# Patient Record
Sex: Female | Born: 1995 | Race: Black or African American | Hispanic: No | Marital: Single | Smoking: Never smoker
Health system: Southern US, Community
[De-identification: ages and names within clinical notes are randomized; demographics above are authoritative.]

## PROBLEM LIST (undated history)

## (undated) DIAGNOSIS — N83209 Unspecified ovarian cyst, unspecified side: Secondary | ICD-10-CM

## (undated) DIAGNOSIS — Z789 Other specified health status: Secondary | ICD-10-CM

## (undated) HISTORY — PX: CYST EXCISION: SHX5701

---
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
## 2014-06-04 ENCOUNTER — Inpatient Hospital Stay (HOSPITAL_COMMUNITY)
Admission: AD | Admit: 2014-06-04 | Discharge: 2014-06-04 | Disposition: A | Discharge status: Home / Self Care | Payer: PRIVATE HEALTH INSURANCE | Source: Ambulatory Visit | Attending: Obstetrics and Gynecology | Admitting: Obstetrics and Gynecology

## 2014-06-04 DIAGNOSIS — T8389XA Other specified complication of genitourinary prosthetic devices, implants and grafts, initial encounter: Secondary | ICD-10-CM

## 2014-06-04 DIAGNOSIS — T8339XA Other mechanical complication of intrauterine contraceptive device, initial encounter: Secondary | ICD-10-CM | POA: Diagnosis present

## 2014-06-04 DIAGNOSIS — N390 Urinary tract infection, site not specified: Secondary | ICD-10-CM | POA: Diagnosis not present

## 2014-06-04 LAB — URINE MICROSCOPIC-ADD ON

## 2014-06-04 LAB — URINALYSIS, ROUTINE W REFLEX MICROSCOPIC
Bilirubin Urine: NEGATIVE
Glucose, UA: NEGATIVE mg/dL
KETONES UR: NEGATIVE mg/dL
NITRITE: POSITIVE — AB
PH: 6 (ref 5.0–8.0)
Protein, ur: NEGATIVE mg/dL
Specific Gravity, Urine: 1.025 (ref 1.005–1.030)
Urobilinogen, UA: 0.2 mg/dL (ref 0.0–1.0)

## 2014-06-04 LAB — POCT PREGNANCY, URINE: Preg Test, Ur: NEGATIVE

## 2014-06-04 MED ORDER — SULFAMETHOXAZOLE-TRIMETHOPRIM 800-160 MG PO TABS: 1.0000 | ORAL_TABLET | Freq: Two times a day (BID) | ORAL | Status: AC

## 2014-06-04 MED ORDER — NORGESTIMATE-ETH ESTRADIOL 0.25-35 MG-MCG PO TABS: 1.0000 | ORAL_TABLET | Freq: Every day | ORAL | Status: DC

## 2014-06-04 MED ORDER — PHENAZOPYRIDINE HCL 200 MG PO TABS
200.0000 mg | ORAL_TABLET | Freq: Three times a day (TID) | ORAL | Status: DC
Start: 2014-06-04 — End: 2014-11-01

## 2014-06-04 NOTE — MAU Provider Note (Signed)
Subjective:  Alexandra Quinn is a 19 y.o. female No obstetric history on file who presents with concerns about her IUD coming out. She has had her IUD since last July and last night during intercourse the IUD came out. The patient brought the IUD into MAU; intact. She would like another form of birth control until she can get into a practice to get another IUD place.  She is also concerned because her urine has an unusual odor.   Denies fever. Currently has mild, lower abdominal cramping that comes and goes.    Objective:  GENERAL: Well-developed, well-nourished female in no acute distress.  LUNGS: Effort normal SKIN: Warm, dry and without erythema PSYCH: Normal mood and affect  Filed Vitals:   06/04/14 1409  BP: 115/69  Pulse: 69  Temp: 97.8 F (36.6 C)  Resp: 18   Results for orders placed or performed during the hospital encounter of 06/04/14 (from the past 72 hour(s))  Urinalysis, Routine w reflex microscopic     Status: Abnormal   Collection Time: 06/04/14  2:20 PM  Result Value Ref Range   Color, Urine YELLOW YELLOW   APPearance CLEAR CLEAR   Specific Gravity, Urine 1.025 1.005 - 1.030   pH 6.0 5.0 - 8.0   Glucose, UA NEGATIVE NEGATIVE mg/dL   Hgb urine dipstick MODERATE (A) NEGATIVE   Bilirubin Urine NEGATIVE NEGATIVE   Ketones, ur NEGATIVE NEGATIVE mg/dL   Protein, ur NEGATIVE NEGATIVE mg/dL   Urobilinogen, UA 0.2 0.0 - 1.0 mg/dL   Nitrite POSITIVE (A) NEGATIVE   Leukocytes, UA SMALL (A) NEGATIVE  Urine microscopic-add on     Status: Abnormal   Collection Time: 06/04/14  2:20 PM  Result Value Ref Range   Squamous Epithelial / LPF FEW (A) RARE   WBC, UA 7-10 <3 WBC/hpf   RBC / HPF 3-6 <3 RBC/hpf   Bacteria, UA MANY (A) RARE   Urine-Other MUCOUS PRESENT   Pregnancy, urine POC     Status: None   Collection Time: 06/04/14  2:45 PM  Result Value Ref Range   Preg Test, Ur NEGATIVE NEGATIVE    Comment:        THE SENSITIVITY OF THIS METHODOLOGY IS >24 mIU/mL     MDM  Urine culture.    Assessment:  1. UTI (lower urinary tract infection)   2. IUD complication, initial encounter     Plan:  Discharge home in stable condition RX: bactrim, pyridium, sprintec  Follow up with health dep. Or WOC or IUD placement.  Condoms always    Duane LopeJennifer I Larra Crunkleton, NP 06/04/2014 3:41 PM

## 2014-06-04 NOTE — MAU Note (Signed)
Pt had sex last night, condom was stuck, pt removed condom & IUD came out also.  Lower abd cramping, is bleeding, was bleeding before IUD came out.  Urine has unusual odor.

## 2014-06-06 LAB — URINE CULTURE: Colony Count: >=100000

## 2014-11-01 ENCOUNTER — Inpatient Hospital Stay (HOSPITAL_COMMUNITY)
Admission: AD | Admit: 2014-11-01 | Discharge: 2014-11-01 | Disposition: A | Discharge status: Home / Self Care | Payer: Medicaid - Out of State | Source: Ambulatory Visit | Attending: Family Medicine | Admitting: Family Medicine

## 2014-11-01 ENCOUNTER — Encounter (HOSPITAL_COMMUNITY): Payer: Self-pay | Admitting: *Deleted

## 2014-11-01 DIAGNOSIS — B9689 Other specified bacterial agents as the cause of diseases classified elsewhere: Secondary | ICD-10-CM | POA: Insufficient documentation

## 2014-11-01 DIAGNOSIS — N898 Other specified noninflammatory disorders of vagina: Secondary | ICD-10-CM | POA: Diagnosis present

## 2014-11-01 DIAGNOSIS — A499 Bacterial infection, unspecified: Secondary | ICD-10-CM

## 2014-11-01 DIAGNOSIS — N760 Acute vaginitis: Secondary | ICD-10-CM | POA: Insufficient documentation

## 2014-11-01 LAB — URINALYSIS, ROUTINE W REFLEX MICROSCOPIC
Bilirubin Urine: NEGATIVE
Glucose, UA: NEGATIVE mg/dL
Hgb urine dipstick: NEGATIVE
Ketones, ur: NEGATIVE mg/dL
LEUKOCYTES UA: NEGATIVE
Nitrite: NEGATIVE
PH: 7 (ref 5.0–8.0)
Protein, ur: NEGATIVE mg/dL
SPECIFIC GRAVITY, URINE: 1.01 (ref 1.005–1.030)
UROBILINOGEN UA: 1 mg/dL (ref 0.0–1.0)

## 2014-11-01 LAB — POCT PREGNANCY, URINE: PREG TEST UR: NEGATIVE

## 2014-11-01 LAB — WET PREP, GENITAL
TRICH WET PREP: NONE SEEN
YEAST WET PREP: NONE SEEN

## 2014-11-01 MED ORDER — METRONIDAZOLE 500 MG PO TABS: 500.0000 mg | ORAL_TABLET | Freq: Two times a day (BID) | ORAL | Status: DC

## 2014-11-01 NOTE — Discharge Instructions (Signed)
Bacterial Vaginosis Bacterial vaginosis is a vaginal infection that occurs when the normal balance of bacteria in the vagina is disrupted. It results from an overgrowth of certain bacteria. This is the most common vaginal infection in women of childbearing age. Treatment is important to prevent complications, especially in pregnant women, as it can cause a premature delivery. CAUSES  Bacterial vaginosis is caused by an increase in harmful bacteria that are normally present in smaller amounts in the vagina. Several different kinds of bacteria can cause bacterial vaginosis. However, the reason that the condition develops is not fully understood. RISK FACTORS Certain activities or behaviors can put you at an increased risk of developing bacterial vaginosis, including:  Having a new sex partner or multiple sex partners.  Douching.  Using an intrauterine device (IUD) for contraception. Women do not get bacterial vaginosis from toilet seats, bedding, swimming pools, or contact with objects around them. SIGNS AND SYMPTOMS  Some women with bacterial vaginosis have no signs or symptoms. Common symptoms include:  Grey vaginal discharge.  A fishlike odor with discharge, especially after sexual intercourse.  Itching or burning of the vagina and vulva.  Burning or pain with urination. DIAGNOSIS  Your health care provider will take a medical history and examine the vagina for signs of bacterial vaginosis. A sample of vaginal fluid may be taken. Your health care provider will look at this sample under a microscope to check for bacteria and abnormal cells. A vaginal pH test may also be done.  TREATMENT  Bacterial vaginosis may be treated with antibiotic medicines. These may be given in the form of a pill or a vaginal cream. A second round of antibiotics may be prescribed if the condition comes back after treatment.  HOME CARE INSTRUCTIONS   Only take over-the-counter or prescription medicines as  directed by your health care provider.  If antibiotic medicine was prescribed, take it as directed. Make sure you finish it even if you start to feel better.  Do not have sex until treatment is completed.  Tell all sexual partners that you have a vaginal infection. They should see their health care provider and be treated if they have problems, such as a mild rash or itching.  Practice safe sex by using condoms and only having one sex partner. SEEK MEDICAL CARE IF:   Your symptoms are not improving after 3 days of treatment.  You have increased discharge or pain.  You have a fever. MAKE SURE YOU:   Understand these instructions.  Will watch your condition.  Will get help right away if you are not doing well or get worse. FOR MORE INFORMATION  Centers for Disease Control and Prevention, Division of STD Prevention: www.cdc.gov/std American Sexual Health Association (ASHA): www.ashastd.org  Document Released: 02/02/2005 Document Revised: 11/23/2012 Document Reviewed: 09/14/2012 ExitCare Patient Information 2015 [employer]. This information is not intended to replace advice given to you by your health care provider. Make sure you discuss any questions you have with your health care provider.  

## 2014-11-01 NOTE — MAU Provider Note (Signed)
History   [id number]   Chief Complaint  Patient presents with  . Vaginal Discharge    HPI Alexandra Quinn is a 19 y.o. female  G1P0000 here with report of white vaginal discharge that started one week ago.  +"fishy odor".    Patient's last menstrual period was 10/06/2014.  OB History  Gravida Para Term Preterm AB SAB TAB Ectopic Multiple Living  1 0 0 0 0     0    # Outcome Date GA Lbr Len/2nd Weight Sex Delivery Anes PTL Lv  1 Gravida               History reviewed. No pertinent past medical history.  History reviewed. No pertinent family history.  Social History   Social History  . Marital Status: Single    Spouse Name: N/A  . Number of Children: N/A  . Years of Education: N/A   Social History Main Topics  . Smoking status: Never Smoker   . Smokeless tobacco: Never Used  . Alcohol Use: No  . Drug Use: No  . Sexual Activity: Yes    Birth Control/ Protection: IUD   Other Topics Concern  . None   Social History Narrative  . None    Allergies not on file  No current facility-administered medications on file prior to encounter.   Current Outpatient Prescriptions on File Prior to Encounter  Medication Sig Dispense Refill  . norgestimate-ethinyl estradiol (ORTHO-CYCLEN,SPRINTEC,PREVIFEM) 0.25-35 MG-MCG tablet Take 1 tablet by mouth daily. 1 Package 11  . phenazopyridine (PYRIDIUM) 200 MG tablet Take 1 tablet (200 mg total) by mouth 3 (three) times daily. 6 tablet 0     Review of Systems  Genitourinary: Positive for vaginal discharge (white; "fishy odor"). Negative for vaginal bleeding.  All other systems reviewed and are negative.    Physical Exam   Filed Vitals:   11/01/14 1922  BP: 117/69  Pulse: 53  Temp: 98.4 F (36.9 C)  TempSrc: Oral  Resp: 16  Height: 5\' 3"  (1.6 m)  Weight: 63.957 kg (141 lb)  SpO2: 100%    Physical Exam  Constitutional: She is oriented to person, place, and time. She appears well-developed and well-nourished. No  distress.  HENT:  Head: Normocephalic.  Neck: Normal range of motion. Neck supple.  Cardiovascular: Normal rate, regular rhythm and normal heart sounds.   Respiratory: Effort normal and breath sounds normal. No respiratory distress.  GI: Soft. She exhibits no mass. There is no tenderness. There is no rebound, no guarding and no CVA tenderness.  Genitourinary: Cervix exhibits no motion tenderness and no discharge. Vaginal discharge (white, creamy) found.  Musculoskeletal: Normal range of motion. She exhibits no edema.  Neurological: She is alert and oriented to person, place, and time.  Skin: Skin is warm and dry.  Psychiatric: She has a normal mood and affect.    MAU Course  Procedures  MDM Results for orders placed or performed during the hospital encounter of 11/01/14 (from the past 24 hour(s))  Urinalysis, Routine w reflex microscopic (not at [facility])     Status: None   Collection Time: 11/01/14  7:24 PM  Result Value Ref Range   Color, Urine YELLOW YELLOW   APPearance CLEAR CLEAR   Specific Gravity, Urine 1.010 1.005 - 1.030   pH 7.0 5.0 - 8.0   Glucose, UA NEGATIVE NEGATIVE mg/dL   Hgb urine dipstick NEGATIVE NEGATIVE   Bilirubin Urine NEGATIVE NEGATIVE   Ketones, ur NEGATIVE NEGATIVE mg/dL   Protein,  ur NEGATIVE NEGATIVE mg/dL   Urobilinogen, UA 1.0 0.0 - 1.0 mg/dL   Nitrite NEGATIVE NEGATIVE   Leukocytes, UA NEGATIVE NEGATIVE  Pregnancy, urine POC     Status: None   Collection Time: 11/01/14  7:31 PM  Result Value Ref Range   Preg Test, Ur NEGATIVE NEGATIVE  Wet prep, genital     Status: Abnormal   Collection Time: 11/01/14  8:15 PM  Result Value Ref Range   Yeast Wet Prep HPF POC NONE SEEN NONE SEEN   Trich, Wet Prep NONE SEEN NONE SEEN   Clue Cells Wet Prep HPF POC FEW (A) NONE SEEN   WBC, Wet Prep HPF POC FEW (A) NONE SEEN     Assessment and Plan   Bacterial Vaginosis  Plan: Discharge to home RX Flagyl 500 mg BID x 7 days Follow-up for worsening or no  improvement in symptoms  Marlis Edelson, CNM

## 2014-11-01 NOTE — MAU Note (Signed)
Pt reports she has bacterial vaginosis. States she gets this a lot and she knows that is what it is due to the vaginal odor.

## 2014-12-23 ENCOUNTER — Inpatient Hospital Stay (HOSPITAL_COMMUNITY): Payer: Medicaid - Out of State

## 2014-12-23 ENCOUNTER — Encounter (HOSPITAL_COMMUNITY): Payer: Self-pay | Admitting: *Deleted

## 2014-12-23 ENCOUNTER — Inpatient Hospital Stay (HOSPITAL_COMMUNITY)
Admission: AD | Admit: 2014-12-23 | Discharge: 2014-12-23 | Disposition: A | Discharge status: Home / Self Care | Payer: Medicaid - Out of State | Source: Ambulatory Visit | Attending: Obstetrics & Gynecology | Admitting: Obstetrics & Gynecology

## 2014-12-23 DIAGNOSIS — R300 Dysuria: Secondary | ICD-10-CM | POA: Diagnosis not present

## 2014-12-23 DIAGNOSIS — N920 Excessive and frequent menstruation with regular cycle: Secondary | ICD-10-CM | POA: Insufficient documentation

## 2014-12-23 DIAGNOSIS — R1032 Left lower quadrant pain: Secondary | ICD-10-CM | POA: Diagnosis present

## 2014-12-23 DIAGNOSIS — T8389XA Other specified complication of genitourinary prosthetic devices, implants and grafts, initial encounter: Secondary | ICD-10-CM

## 2014-12-23 DIAGNOSIS — Y838 Other surgical procedures as the cause of abnormal reaction of the patient, or of later complication, without mention of misadventure at the time of the procedure: Secondary | ICD-10-CM | POA: Insufficient documentation

## 2014-12-23 DIAGNOSIS — Z8742 Personal history of other diseases of the female genital tract: Secondary | ICD-10-CM

## 2014-12-23 DIAGNOSIS — T8332XA Displacement of intrauterine contraceptive device, initial encounter: Secondary | ICD-10-CM

## 2014-12-23 DIAGNOSIS — T8384XA Pain from genitourinary prosthetic devices, implants and grafts, initial encounter: Secondary | ICD-10-CM | POA: Diagnosis not present

## 2014-12-23 DIAGNOSIS — Z30432 Encounter for removal of intrauterine contraceptive device: Secondary | ICD-10-CM | POA: Diagnosis not present

## 2014-12-23 HISTORY — DX: Other specified health status: Z78.9

## 2014-12-23 LAB — WET PREP, GENITAL
CLUE CELLS WET PREP: NONE SEEN
TRICH WET PREP: NONE SEEN
Yeast Wet Prep HPF POC: NONE SEEN

## 2014-12-23 LAB — URINALYSIS, ROUTINE W REFLEX MICROSCOPIC
Bilirubin Urine: NEGATIVE
GLUCOSE, UA: NEGATIVE mg/dL
Hgb urine dipstick: NEGATIVE
Ketones, ur: NEGATIVE mg/dL
Leukocytes, UA: NEGATIVE
Nitrite: NEGATIVE
PH: 6 (ref 5.0–8.0)
Protein, ur: NEGATIVE mg/dL
SPECIFIC GRAVITY, URINE: 1.02 (ref 1.005–1.030)
Urobilinogen, UA: 0.2 mg/dL (ref 0.0–1.0)

## 2014-12-23 LAB — POCT PREGNANCY, URINE: Preg Test, Ur: NEGATIVE

## 2014-12-23 LAB — CBC
HCT: 36.6 % (ref 36.0–46.0)
Hemoglobin: 11.8 g/dL — ABNORMAL LOW (ref 12.0–15.0)
MCH: 29.4 pg (ref 26.0–34.0)
MCHC: 32.2 g/dL (ref 30.0–36.0)
MCV: 91.3 fL (ref 78.0–100.0)
PLATELETS: 200 10*3/uL (ref 150–400)
RBC: 4.01 MIL/uL (ref 3.87–5.11)
RDW: 15 % (ref 11.5–15.5)
WBC: 4.2 10*3/uL (ref 4.0–10.5)

## 2014-12-23 MED ORDER — IBUPROFEN 600 MG PO TABS
600.0000 mg | ORAL_TABLET | Freq: Once | ORAL | Status: AC
  Administered 2014-12-23: 600 mg via ORAL
  Filled 2014-12-23: qty 1

## 2014-12-23 MED ORDER — IBUPROFEN 600 MG PO TABS: 600.0000 mg | ORAL_TABLET | Freq: Four times a day (QID) | ORAL | Status: DC | PRN

## 2014-12-23 NOTE — MAU Note (Signed)
Patient presents stating that se has an IUD in place with c/o left sided flank pain X 2-3 months and painful urination X 1 month. States she had surgery to remove a left ovarian cyst in 2013. States she had bleeding last week and passed clots and also c/o discharge.

## 2014-12-23 NOTE — MAU Provider Note (Signed)
Chief Complaint: Flank Pain and Urinary Tract Infection   First Provider Initiated Contact with Patient 12/23/14 1147     SUBJECTIVE HPI: Alexandra Quinn is a 19 y.o. G1P0010 female w/ Liletta IUD in place who presents to Maternity Admissions reporting:  1. LLQ pain x 2-3 months 2. Heavy period w/ clots past few cycles. Had been light and short since IUD placed this summer. 3. Dysuria x 1 month. Feels burning only for several seconds or minutes after urination stops.  4. Increased vaginal discharge   PAIN Location: Left lower quadrant Quality: Stabbing Severity: 8/10 on pain scale Duration: 2-3 months Context: None Timing: Intermittent Modifying factors: Improves with warm compresses. Hasn't tried any medicines or other treatment for pain. Associated signs and symptoms: Positive for heavy periods with clots, vaginal discharge, nausea and vomiting x 1 and dysuria. Negative for fever, chills, passage of IUD, dyspareunia, vaginal odor or vaginal itching.  Had IUD before that was expelled in April 2016.  Past Medical History  Diagnosis Date  . Medical history non-contributory    OB History  Gravida Para Term Preterm AB SAB TAB Ectopic Multiple Living  1 0 0 0 1 1    0    # Outcome Date GA Lbr Len/2nd Weight Sex Delivery Anes PTL Lv  1 SAB              Past Surgical History  Procedure Laterality Date  . Cyst excision    . Ovarian cyst surgery  2013   Social History   Social History  . Marital Status: Single    Spouse Name: N/A  . Number of Children: N/A  . Years of Education: N/A   Occupational History  . Not on file.   Social History Main Topics  . Smoking status: Never Smoker   . Smokeless tobacco: Never Used  . Alcohol Use: No  . Drug Use: No  . Sexual Activity: Yes    Birth Control/ Protection: IUD   Other Topics Concern  . Not on file   Social History Narrative   No current facility-administered medications on file prior to encounter.   Current  Outpatient Prescriptions on File Prior to Encounter  Medication Sig Dispense Refill  . Levonorgestrel (SKYLA) 13.5 MG IUD 1 application by Intrauterine route daily. Replaced every 4 years, inserted 07/2014    . metroNIDAZOLE (FLAGYL) 500 MG tablet Take 1 tablet (500 mg total) by mouth 2 (two) times daily. 14 tablet 0   No Known Allergies  I have reviewed the past Medical Hx, Surgical Hx, Social Hx, Allergies and Medications.   Review of Systems  Constitutional: Negative for fever, chills and appetite change.  Gastrointestinal: Positive for nausea (x 2 days ), vomiting (x1) and abdominal pain. Negative for diarrhea, constipation, blood in stool and abdominal distention.    OBJECTIVE Patient Vitals for the past 24 hrs:  BP Temp Temp src Pulse Resp Height Weight  12/23/14 1313 127/58 mmHg - - (!) 59 16 - -  12/23/14 1041 121/77 mmHg 99.1 F (37.3 C) Oral (!) 57 16  (1.651 m) 64.524 kg (142 lb 4 oz)   Constitutional: Well-developed, well-nourished female in no acute distress.  Cardiovascular: Mild, asymptomatic bradycardia Respiratory: normal rate and effort.  GI: Abd soft, mild left lower quadrant and mid left abdominal tenderness to palpation. No guarding, rebound tenderness or masses. Pos BS x 4 MS: Extremities nontender, no edema. Neurologic: Alert and oriented x 4.  GU: Neg CVAT.  SPECULUM EXAM:  NEFG, physiologic discharge, no blood noted, cervix clean. IUD strings visualized. Device not protruding through os  BIMANUAL: cervix closed; uterus normal size, no adnexal tenderness or masses. No CMT.  LAB RESULTS Results for orders placed or performed during the hospital encounter of 12/23/14 (from the past 24 hour(s))  Urinalysis, Routine w reflex microscopic (not at [facility])     Status: None   Collection Time: 12/23/14 10:51 AM  Result Value Ref Range   Color, Urine YELLOW YELLOW   APPearance CLEAR CLEAR   Specific Gravity, Urine 1.020 1.005 - 1.030   pH 6.0 5.0 - 8.0   Glucose,  UA NEGATIVE NEGATIVE mg/dL   Hgb urine dipstick NEGATIVE NEGATIVE   Bilirubin Urine NEGATIVE NEGATIVE   Ketones, ur NEGATIVE NEGATIVE mg/dL   Protein, ur NEGATIVE NEGATIVE mg/dL   Urobilinogen, UA 0.2 0.0 - 1.0 mg/dL   Nitrite NEGATIVE NEGATIVE   Leukocytes, UA NEGATIVE NEGATIVE  Pregnancy, urine POC     Status: None   Collection Time: 12/23/14 11:01 AM  Result Value Ref Range   Preg Test, Ur NEGATIVE NEGATIVE  CBC     Status: Abnormal   Collection Time: 12/23/14 11:52 AM  Result Value Ref Range   WBC 4.2 4.0 - 10.5 K/uL   RBC 4.01 3.87 - 5.11 MIL/uL   Hemoglobin 11.8 (L) 12.0 - 15.0 g/dL   HCT 40.9 81.1 - 91.4 %   MCV 91.3 78.0 - 100.0 fL   MCH 29.4 26.0 - 34.0 pg   MCHC 32.2 30.0 - 36.0 g/dL   RDW 78.2 95.6 - 21.3 %   Platelets 200 150 - 400 K/uL  Wet prep, genital     Status: Abnormal   Collection Time: 12/23/14 12:05 PM  Result Value Ref Range   Yeast Wet Prep HPF POC NONE SEEN NONE SEEN   Trich, Wet Prep NONE SEEN NONE SEEN   Clue Cells Wet Prep HPF POC NONE SEEN NONE SEEN   WBC, Wet Prep HPF POC FEW (A) NONE SEEN    IMAGING US Transvaginal Non-ob  12/23/2014  CLINICAL DATA:  19 year old female with left lower quadrant pain since August 2016. EXAM: TRANSABDOMINAL AND TRANSVAGINAL ULTRASOUND OF PELVIS TECHNIQUE: Both transabdominal and transvaginal ultrasound examinations of the pelvis were performed. Transabdominal technique was performed for global imaging of the pelvis including uterus, ovaries, adnexal regions, and pelvic cul-de-sac. It was necessary to proceed with endovaginal exam following the transabdominal exam to visualize the endometrium and ovaries. COMPARISON:  No priors. FINDINGS: Uterus Measurements: 7.9 x 3.5 x 5.1 cm. No fibroids or other mass visualized. Endometrium Thickness: 6.2 mm. No focal abnormality visualized. IUD appears to be in a low position predominantly in the region of the lower uterine segment and cervix. Right ovary Measurements: 4.0 x 2.2 x  1.7 cm. Normal appearance/no adnexal mass. Left ovary Measurements: 3.0 x 1.7 x 1.4 cm. Normal appearance/no adnexal mass. Other findings Trace volume of free fluid in the cul-de-sac, presumably physiologic in this young female. IMPRESSION: 1. IUD is in a low position in the lower uterine segment and proximal cervix. 2. No other acute findings to account for the patient's symptoms. Electronically Signed   By: Trudie Reed M.D.   On: 12/23/2014 13:21   US Pelvis Complete  12/23/2014  CLINICAL DATA:  19 year old female with left lower quadrant pain since August 2016. EXAM: TRANSABDOMINAL AND TRANSVAGINAL ULTRASOUND OF PELVIS TECHNIQUE: Both transabdominal and transvaginal ultrasound examinations of the pelvis were performed. Transabdominal technique was performed for global  imaging of the pelvis including uterus, ovaries, adnexal regions, and pelvic cul-de-sac. It was necessary to proceed with endovaginal exam following the transabdominal exam to visualize the endometrium and ovaries. COMPARISON:  No priors. FINDINGS: Uterus Measurements: 7.9 x 3.5 x 5.1 cm. No fibroids or other mass visualized. Endometrium Thickness: 6.2 mm. No focal abnormality visualized. IUD appears to be in a low position predominantly in the region of the lower uterine segment and cervix. Right ovary Measurements: 4.0 x 2.2 x 1.7 cm. Normal appearance/no adnexal mass. Left ovary Measurements: 3.0 x 1.7 x 1.4 cm. Normal appearance/no adnexal mass. Other findings Trace volume of free fluid in the cul-de-sac, presumably physiologic in this young female. IMPRESSION: 1. IUD is in a low position in the lower uterine segment and proximal cervix. 2. No other acute findings to account for the patient's symptoms. Electronically Signed   By: Trudie Reedaniel  Entrikin M.D.   On: 12/23/2014 13:21    MAU COURSE UA, UPT, CBC, pelvic ultrasound, wet prep, GC/chlamydia cultures, warm compress.  IUD positioned in lower uterine segment/cervix. Discussed with  patient that this is likely the cause of her pain and recent episodes of heavy bleeding. Also explained that it could interfere with the effectiveness of the IUD. Offered removal in MAU versus removal at a gynecologist, but patient [place] not have gynecologist in [place] and strongly prefers removal in MAU  Procedure note IUD strings grasped with ring forceps. Device removed intact with gentle traction. Scant vaginal bleeding. Patient tolerated well. Ibuprofen and warm compress given.  MDM 19 year old nonpregnant female with malpositioned IUD that was removed successfully in maternity admissions. Although this is a reasonable explanation for her left lower quadrant pain, heavy vaginal bleeding and possibly vaginal discharge the cause of her dysuria is not clear. No evidence of UTI, [place] or kidney stones on UA on exam. If symptoms do not resolve she may need to address this with primary care provider.  ASSESSMENT 1. Malpositioned IUD, initial encounter (HCC)   2. LLQ abdominal pain   3. History of ovarian cyst   4. Dysuria    PLAN Discharge home in stable condition. Explained to patient and partner that patient should consider herself immediately fertile and needs to either delay sexual intercourse until she has started on a new contraceptive or use condoms perfectly. Patient would like to get Nexplanon.   Follow-up Information    Follow up with [facility].   Contact information:   [address] [place], [place] [address] Phone: [phone]      [facility].   Why:  As needed and gynecologic emergencies   Contact information:   [address] [id number] mc [place] [id number] [phone]      Follow [facility] with [facility].   Specialty:  Obstetrics and Gynecology   Contact information:   [address] [place] [id number] [phone]        Medication List     STOP taking these medications        metroNIDAZOLE 500 MG tablet  Commonly known as:  FLAGYL     SKYLA 13.5 MG Iud  Generic drug:  Levonorgestrel      TAKE these medications        ibuprofen 600 MG tablet  Commonly known as:  ADVIL,MOTRIN  Take 1 tablet (600 mg total) by mouth every 6 (six) hours as needed for cramping.       [place] Tagan Bartram, [place] 12/23/2014  10:56 AM

## 2014-12-23 NOTE — Discharge Instructions (Signed)
Etonogestrel implant What is this medicine? ETONOGESTREL (et oh noe JES trel) is a contraceptive (birth control) device. It is used to prevent pregnancy. It can be used for up to 3 years. This medicine may be used for other purposes; ask your health care provider or pharmacist if you have questions. What should I tell my health care provider before I take this medicine? They need to know if you have any of these conditions: -abnormal vaginal bleeding -blood vessel disease or blood clots -cancer of the breast, cervix, or liver -depression -diabetes -gallbladder disease -headaches -heart disease or recent heart attack -high blood pressure -high cholesterol -kidney disease -liver disease -renal disease -seizures -tobacco smoker -an unusual or allergic reaction to etonogestrel, other hormones, anesthetics or antiseptics, medicines, foods, dyes, or preservatives -pregnant or trying to get pregnant -breast-feeding How should I use this medicine? This device is inserted just under the skin on the inner side of your upper arm by a health care professional. Talk to your pediatrician regarding the use of this medicine in children. Special care may be needed. Overdosage: If you think you have taken too much of this medicine contact a poison control center or emergency room at once. NOTE: This medicine is only for you. Do not share this medicine with others. What if I miss a dose? This does not apply. What may interact with this medicine? Do not take this medicine with any of the following medications: -amprenavir -bosentan -fosamprenavir This medicine may also interact with the following medications: -barbiturate medicines for inducing sleep or treating seizures -certain medicines for fungal infections like ketoconazole and itraconazole -griseofulvin -medicines to treat seizures like carbamazepine, felbamate, oxcarbazepine, phenytoin,  topiramate -modafinil -phenylbutazone -rifampin -some medicines to treat HIV infection like atazanavir, indinavir, lopinavir, nelfinavir, tipranavir, ritonavir -St. John's wort This list may not describe all possible interactions. Give your health care provider a list of all the medicines, herbs, non-prescription drugs, or dietary supplements you use. Also tell them if you smoke, drink alcohol, or use illegal drugs. Some items may interact with your medicine. What should I watch for while using this medicine? This product does not protect you against HIV infection (AIDS) or other sexually transmitted diseases. You should be able to feel the implant by pressing your fingertips over the skin where it was inserted. Contact your doctor if you cannot feel the implant, and use a non-hormonal birth control method (such as condoms) until your doctor confirms that the implant is in place. If you feel that the implant may have broken or become bent while in your arm, contact your healthcare provider. What side effects may I notice from receiving this medicine? Side effects that you should report to your doctor or health care professional as soon as possible: -allergic reactions like skin rash, itching or hives, swelling of the face, lips, or tongue -breast lumps -changes in emotions or moods -depressed mood -heavy or prolonged menstrual bleeding -pain, irritation, swelling, or bruising at the insertion site -scar at site of insertion -signs of infection at the insertion site such as fever, and skin redness, pain or discharge -signs of pregnancy -signs and symptoms of a blood clot such as breathing problems; changes in vision; chest pain; severe, sudden headache; pain, swelling, warmth in the leg; trouble speaking; sudden numbness or weakness of the face, arm or leg -signs and symptoms of liver injury like dark yellow or brown urine; general ill feeling or flu-like symptoms; light-colored stools; loss of  appetite; nausea; right upper belly   pain; unusually weak or tired; yellowing of the eyes or skin -unusual vaginal bleeding, discharge -signs and symptoms of a stroke like changes in vision; confusion; trouble speaking or understanding; severe headaches; sudden numbness or weakness of the face, arm or leg; trouble walking; dizziness; loss of balance or coordination Side effects that usually do not require medical attention (Report these to your doctor or health care professional if they continue or are bothersome.): -acne -back pain -breast pain -changes in weight -dizziness -general ill feeling or flu-like symptoms -headache -irregular menstrual bleeding -nausea -sore throat -vaginal irritation or inflammation This list may not describe all possible side effects. Call your doctor for medical advice about side effects. You may report side effects to FDA at 1-800-FDA-1088. Where should I keep my medicine? This drug is given in a hospital or clinic and will not be stored at home. NOTE: This sheet is a summary. It may not cover all possible information. If you have questions about this medicine, talk to your doctor, pharmacist, or health care provider.    2016, Elsevier/[employer] Standard. (2013-11-17 14:07:06)  

## 2014-12-24 LAB — HIV ANTIBODY (ROUTINE TESTING W REFLEX): HIV Screen 4th Generation wRfx: NONREACTIVE

## 2014-12-24 LAB — GC/CHLAMYDIA PROBE AMP (~~LOC~~) NOT AT ARMC
Chlamydia: NEGATIVE
Neisseria Gonorrhea: NEGATIVE

## 2015-06-26 ENCOUNTER — Emergency Department (HOSPITAL_COMMUNITY): Payer: Medicaid - Out of State

## 2015-06-26 ENCOUNTER — Encounter (HOSPITAL_COMMUNITY): Payer: Self-pay | Admitting: Emergency Medicine

## 2015-06-26 ENCOUNTER — Emergency Department (HOSPITAL_COMMUNITY)
Admission: EM | Admit: 2015-06-26 | Discharge: 2015-06-26 | Disposition: A | Discharge status: Home / Self Care | Payer: Medicaid - Out of State | Attending: Emergency Medicine | Admitting: Emergency Medicine

## 2015-06-26 DIAGNOSIS — Y939 Activity, unspecified: Secondary | ICD-10-CM | POA: Diagnosis not present

## 2015-06-26 DIAGNOSIS — Z791 Long term (current) use of non-steroidal anti-inflammatories (NSAID): Secondary | ICD-10-CM | POA: Diagnosis not present

## 2015-06-26 DIAGNOSIS — S6991XA Unspecified injury of right wrist, hand and finger(s), initial encounter: Secondary | ICD-10-CM

## 2015-06-26 DIAGNOSIS — S6010XA Contusion of unspecified finger with damage to nail, initial encounter: Secondary | ICD-10-CM

## 2015-06-26 DIAGNOSIS — Y999 Unspecified external cause status: Secondary | ICD-10-CM | POA: Insufficient documentation

## 2015-06-26 DIAGNOSIS — S61230A Puncture wound without foreign body of right index finger without damage to nail, initial encounter: Secondary | ICD-10-CM | POA: Insufficient documentation

## 2015-06-26 DIAGNOSIS — W231XXA Caught, crushed, jammed, or pinched between stationary objects, initial encounter: Secondary | ICD-10-CM | POA: Diagnosis not present

## 2015-06-26 DIAGNOSIS — Y929 Unspecified place or not applicable: Secondary | ICD-10-CM | POA: Insufficient documentation

## 2015-06-26 MED ORDER — IBUPROFEN 800 MG PO TABS
800.0000 mg | ORAL_TABLET | Freq: Once | ORAL | Status: AC
  Administered 2015-06-26: 800 mg via ORAL
  Filled 2015-06-26: qty 1

## 2015-06-26 MED ORDER — IBUPROFEN 800 MG PO TABS: 800.0000 mg | ORAL_TABLET | Freq: Three times a day (TID) | ORAL | Status: DC

## 2015-06-26 NOTE — Discharge Instructions (Signed)
Take the prescribed medication as directed.  Your finger may continue to be sore for the next few days which is normal.  Bruising under the nail should gradually resolve. Follow-up with your primary care doctor. Return to the ED for new or worsening symptoms.

## 2015-06-26 NOTE — ED Provider Notes (Signed)
CSN: [id number]     Arrival date & time 06/26/15  1411 History  By signing my name below, I, Octavia Heir, attest that this documentation has been prepared under the direction and in the presence of Sharilyn Sites, PA-C. Electronically Signed: Octavia Heir, ED Scribe. 06/26/2015. 3:27 PM.    Chief Complaint  Patient presents with  . Hand Injury      The history is provided by the patient. No language interpreter was used.   HPI Comments: Alexandra Quinn is a 20 y.o. female who presents to the Emergency Department complaining of a constant, gradual worsening, moderate, right index finger injury with associated swelling onset this afternoon. Pt reports slamming her right index finger into a car door. She has small bleeding to the middle of her finger. She notes she is unable to move her finger. Pt has not taken any medication to alleviate her pain. Pt has no known drug allergies. She denies any other complaints. Tetanus is UTD.  Past Medical History  Diagnosis Date  . Medical history non-contributory    Past Surgical History  Procedure Laterality Date  . Cyst excision    . Ovarian cyst surgery  2013   No family history on file. Social History  Substance Use Topics  . Smoking status: Never Smoker   . Smokeless tobacco: Never Used  . Alcohol Use: No   OB History    Gravida Para Term Preterm AB TAB SAB Ectopic Multiple Living   1 0 0 0 1  1   0     Review of Systems  Musculoskeletal: Positive for joint swelling.  Skin: Positive for wound.  All other systems reviewed and are negative.     Allergies  Review of patient's allergies indicates no known allergies.  Home Medications   Prior to Admission medications   Medication Sig Start Date End Date Taking? Authorizing Provider  ibuprofen (ADVIL,MOTRIN) 600 MG tablet Take 1 tablet (600 mg total) by mouth every 6 (six) hours as needed for cramping. 12/23/14   Dorathy Kinsman, CNM   Triage vitals: BP 115/84 mmHg  Pulse 69   Temp(Src) 98.2 F (36.8 C) (Oral)  Resp 18  SpO2 100%  LMP 06/11/2015 Physical Exam  Constitutional: She is oriented to person, place, and time. She appears well-developed and well-nourished.  HENT:  Head: Normocephalic and atraumatic.  Mouth/Throat: Oropharynx is clear and moist.  Eyes: Conjunctivae and EOM are normal. Pupils are equal, round, and reactive to light.  Neck: Normal range of motion.  Cardiovascular: Normal rate, regular rhythm and normal heart sounds.   Pulmonary/Chest: Effort normal and breath sounds normal. No respiratory distress. She has no wheezes.  Abdominal: Soft. Bowel sounds are normal.  Musculoskeletal: Normal range of motion.  Right index finger with small puncture wound noted to palmar aspect of distal digit, no active bleeding; subungual hematoma present, nail remains intact, limited flexion of finger due to poor patient effort and pain, full extension, normal cap refill  Neurological: She is alert and oriented to person, place, and time.  Skin: Skin is warm and dry.  Psychiatric: She has a normal mood and affect.  Nursing note and vitals reviewed.   ED Course  ORTHOPEDIC INJURY TREATMENT Date/Time: 06/26/2015 3:53 PM Performed by: Garlon Hatchet Authorized by: Garlon Hatchet Consent: Verbal consent obtained. Risks and benefits: risks, benefits and alternatives were discussed Consent given by: patient Patient understanding: patient states understanding of the procedure being performed Required items: required blood products, implants, devices,  and special equipment available Patient identity confirmed: verbally with patient Injury location: finger Location details: right index finger Injury type: soft tissue Pre-procedure neurovascular assessment: neurovascularly intact Immobilization: splint Splint type: static finger Supplies used: aluminum splint Post-procedure neurovascular assessment: post-procedure neurovascularly intact Patient tolerance:  Patient tolerated the procedure well with no immediate complications    DIAGNOSTIC STUDIES: Oxygen Saturation is 100% on RA, normal by my interpretation.  COORDINATION OF CARE:  3:26 PM Discussed treatment plan which includes finger splint and pain medication with pt at bedside and pt agreed to plan.  Labs Review Labs Reviewed - No data to display  Imaging Review Dg Finger Index Right  06/26/2015  CLINICAL DATA:  The patient crushed her right index finger in a door today. Pain. Initial encounter. EXAM: RIGHT INDEX FINGER 2+V COMPARISON:  None. FINDINGS: There is no evidence of fracture or dislocation. There is no evidence of arthropathy or other focal bone abnormality. Soft tissues are unremarkable. IMPRESSION: Negative exam. Electronically Signed   By: Drusilla Kannerhomas  Dalessio M.D.   On: 06/26/2015 15:15   I have personally reviewed and evaluated these images and lab results as part of my medical decision-making.   EKG Interpretation None      MDM   Final diagnoses:  Finger injury, right, initial encounter  Subungual hematoma of digit of hand, initial encounter   20 y.o. F here after slamming her right index finger in car door.  Small puncture wound noted to palmar aspect of distal finger, no active bleeding.  Subungual hematoma also noted, nail remains intact.  No bony deformities.  Finger is neurovascularly intact with normal cap refill.  X-ray was obtained, no bony involvement.  Tetanus is UTD.  Finger splint applied for comfort.  Rx motrin.  Follow-up with PCP.  Discussed plan with patient, he/she acknowledged understanding and agreed with plan of care.  Return precautions given for new or worsening symptoms.  I personally performed the services described in this documentation, which was scribed in my presence. The recorded information has been reviewed and is accurate.  Garlon HatchetLisa M Keaghan Bowens, PA-C 06/26/15 1552  Garlon HatchetLisa M Fannye Myer, PA-C 06/26/15 1554  Donnetta HutchingBrian Cook, MD 06/26/15 [phone]

## 2015-06-26 NOTE — ED Notes (Signed)
Slammed right index finger in door today. Small bleeding to middle of distal finger pad. Pt states she can't move her finger, no obvious deformity.

## 2015-06-26 NOTE — ED Notes (Signed)
Walked in room to  discharge pt, pt appears to be upset because, "we did not help her at all." explained that xray was done and showing no injury, also the writer gave her ibuprofen for pain. Pt replied that we did not offer her food while medicating and did not clean the small drop of blood on finger. Ortho tech at bedside applying splint.

## 2015-12-29 ENCOUNTER — Emergency Department (HOSPITAL_COMMUNITY)
Admission: EM | Admit: 2015-12-29 | Discharge: 2015-12-29 | Disposition: A | Discharge status: Home / Self Care | Payer: PRIVATE HEALTH INSURANCE | Attending: Emergency Medicine | Admitting: Emergency Medicine

## 2015-12-29 ENCOUNTER — Encounter (HOSPITAL_COMMUNITY): Payer: Self-pay

## 2015-12-29 DIAGNOSIS — R519 Headache, unspecified: Secondary | ICD-10-CM

## 2015-12-29 DIAGNOSIS — R51 Headache: Secondary | ICD-10-CM | POA: Insufficient documentation

## 2015-12-29 LAB — I-STAT BETA HCG BLOOD, ED (MC, WL, AP ONLY): I-stat hCG, quantitative: <5 m[IU]/mL (ref <5)

## 2015-12-29 MED ORDER — BUTALBITAL-APAP-CAFFEINE 50-325-40 MG PO TABS: 1.0000 | ORAL_TABLET | Freq: Four times a day (QID) | ORAL | 0 refills | Status: AC | PRN

## 2015-12-29 MED ORDER — DIPHENHYDRAMINE HCL 50 MG/ML IJ SOLN
25.0000 mg | Freq: Once | INTRAMUSCULAR | Status: AC
  Administered 2015-12-29: 25 mg via INTRAVENOUS
  Filled 2015-12-29: qty 1

## 2015-12-29 MED ORDER — METOCLOPRAMIDE HCL 5 MG/ML IJ SOLN
10.0000 mg | Freq: Once | INTRAMUSCULAR | Status: AC
  Administered 2015-12-29: 10 mg via INTRAVENOUS
  Filled 2015-12-29: qty 2

## 2015-12-29 MED ORDER — SODIUM CHLORIDE 0.9 % IV BOLUS (SEPSIS)
1000.0000 mL | Freq: Once | INTRAVENOUS | Status: AC
  Administered 2015-12-29: 1000 mL via INTRAVENOUS

## 2015-12-29 MED ORDER — KETOROLAC TROMETHAMINE 30 MG/ML IJ SOLN
30.0000 mg | Freq: Once | INTRAMUSCULAR | Status: AC
  Administered 2015-12-29: 30 mg via INTRAVENOUS
  Filled 2015-12-29: qty 1

## 2015-12-29 MED ORDER — DEXAMETHASONE SODIUM PHOSPHATE 10 MG/ML IJ SOLN
10.0000 mg | Freq: Once | INTRAMUSCULAR | Status: AC
  Administered 2015-12-29: 10 mg via INTRAVENOUS
  Filled 2015-12-29: qty 1

## 2015-12-29 NOTE — ED Triage Notes (Signed)
Headache starting this morning with nausea.  Hx of same.

## 2015-12-29 NOTE — ED Provider Notes (Signed)
[facility]-EMERGENCY DEPT Provider Note   CSN: [id number] Arrival date & time: 12/29/15  1134     History   Chief Complaint Chief Complaint  Patient presents with  . Headache    HPI Alexandra PatKameron S Ems is a 20 y.o. female.  HPI Alexandra Quinn is a 20 y.o. female with PMH significant for Migraine who presents with headache. She states over the last couple weeks she's been experiencing her typical migraine headache daily. She describes it as frontotemporal and throbbing with mild photophobia and nausea. She states she has been seen by a neurologist in the past with multiple negative head CTs. She does not have a neurologist in the area. She denies any head trauma or injury. She denies any recent illness, fever, vomiting, urinary symptoms, gait abnormalities, numbness, weakness, neck stiffness, slurred speech, facial droop. She has taken Tylenol without much relief.  Past Medical History:  Diagnosis Date  . Medical history non-contributory     There are no active problems to display for this patient.   Past Surgical History:  Procedure Laterality Date  . CYST EXCISION    . OVARIAN CYST SURGERY  2013    OB History    Gravida Para Term Preterm AB Living   1 0 0 0 1 0   SAB TAB Ectopic Multiple Live Births   1               Home Medications    Prior to Admission medications   Medication Sig Start Date End Date Taking? Authorizing Provider  acetaminophen (TYLENOL) 325 MG tablet Take 650 mg by mouth every 6 (six) hours as needed for moderate pain.   Yes Historical Provider, MD  BIOTIN PO Take 1 tablet by mouth daily.   Yes Historical Provider, MD  butalbital-acetaminophen-caffeine (FIORICET, ESGIC) 50-325-40 MG tablet Take 1-2 tablets by mouth every 6 (six) hours as needed for headache. 12/29/15 12/28/16  Cheri FowlerKayla Alycea Segoviano, PA-C  ibuprofen (ADVIL,MOTRIN) 800 MG tablet Take 1 tablet (800 mg total) by mouth 3 (three) times daily. Patient not taking: Reported on 12/29/2015 06/26/15   Garlon HatchetLisa M  Sanders, PA-C    Family History History reviewed. No pertinent family history.  Social History Social History  Substance Use Topics  . Smoking status: Never Smoker  . Smokeless tobacco: Never Used  . Alcohol use No     Allergies   Patient has no known allergies.   Review of Systems Review of Systems All other systems negative unless otherwise stated in HPI   Physical Exam Updated Vital Signs BP 119/77 (BP Location: Right Arm)   Pulse 69   Temp 98.5 F (36.9 C) (Oral)   Resp 14   LMP 12/06/2015   SpO2 100%   Physical Exam  Constitutional: She is oriented to person, place, and time. She appears well-developed and well-nourished.  Non-toxic appearance. She does not have a sickly appearance. She does not appear ill.  HENT:  Head: Normocephalic and atraumatic.  Mouth/Throat: Oropharynx is clear and moist.  Eyes: Conjunctivae are normal. Pupils are equal, round, and reactive to light.  Neck: Normal range of motion. Neck supple.  Cardiovascular: Normal rate and regular rhythm.   Pulmonary/Chest: Effort normal and breath sounds normal. No accessory muscle usage or stridor. No respiratory distress. She has no wheezes. She has no rhonchi. She has no rales.  Abdominal: Soft. Bowel sounds are normal. She exhibits no distension. There is no tenderness.  Musculoskeletal: Normal range of motion.  Lymphadenopathy:    She  has no cervical adenopathy.  Neurological: She is alert and oriented to person, place, and time.  Mental Status:   AOx3.  Speech clear without dysarthria. Cranial Nerves:  I-not tested  II-PERRLA  III, IV, VI-EOMs intact  V-temporal and masseter strength intact  VII-symmetrical facial movements intact, no facial droop  VIII-hearing grossly intact bilaterally  IX, X-gag intact  XI-strength of sternomastoid and trapezius muscles 5/5  XII-tongue midline Motor:   Good muscle bulk and tone  Strength 5/5 bilaterally in upper and lower extremities    Cerebellar--intact RAMs, finger to nose intact bilaterally.  Gait normal  No pronator drift Sensory:  Intact in upper and lower extremities   Skin: Skin is warm and dry.  Psychiatric: She has a normal mood and affect. Her behavior is normal.     ED Treatments / Results  Labs (all labs ordered are listed, but only abnormal results are displayed) Labs Reviewed  I-STAT BETA HCG BLOOD, ED ([facility], [facility], [facility] ONLY)    EKG  EKG Interpretation None       Radiology No results found.  Procedures Procedures (including critical care time)  Medications Ordered in ED Medications  sodium chloride 0.9 % bolus 1,000 mL (1,000 mLs Intravenous New Bag/Given 12/29/15 1232)  metoCLOPramide (REGLAN) injection 10 mg (10 mg Intravenous Given 12/29/15 1310)  dexamethasone (DECADRON) injection 10 mg (10 mg Intravenous Given 12/29/15 1310)  diphenhydrAMINE (BENADRYL) injection 25 mg (25 mg Intravenous Given 12/29/15 1311)  ketorolac (TORADOL) 30 MG/ML injection 30 mg (30 mg Intravenous Given 12/29/15 1311)     Initial Impression / Assessment and Plan / ED Course  I have reviewed the triage vital signs and the nursing notes.  Pertinent labs & imaging results that were available during my care of the patient were reviewed by me and considered in my medical decision making (see chart for details).  Clinical Course    Typical migraine headache for the pt. Non focal neuro exam. No recent head trauma. No fever. Doubt meningitis. Doubt intracranial bleed. Doubt normal pressure hydrocephalus. No indication for imaging. Will treat with migraine cocktail and reevaluate.  Feels improved.  Follow up with neurology.  Return precautions discussed.  Stable for discharge.    Final Clinical Impressions(s) / ED Diagnoses   Final diagnoses:  Bad headache    New Prescriptions New Prescriptions   BUTALBITAL-ACETAMINOPHEN-CAFFEINE (FIORICET, ESGIC) 50-325-40 MG TABLET    Take 1-2 tablets by mouth every 6 (six)  hours as needed for headache.     Cheri FowlerKayla Ladawna Walgren, PA-C 12/29/15 1410    Lavera Guiseana Duo Liu, MD 12/29/15 2126

## 2015-12-29 NOTE — ED Notes (Signed)
He has been seen by our surgeon and pt. Agrees with plan to operate today.

## 2016-02-26 ENCOUNTER — Encounter (HOSPITAL_COMMUNITY): Payer: Self-pay | Admitting: *Deleted

## 2016-02-26 ENCOUNTER — Inpatient Hospital Stay (HOSPITAL_COMMUNITY)
Admission: AD | Admit: 2016-02-26 | Discharge: 2016-02-26 | Disposition: A | Discharge status: Home / Self Care | Payer: Medicaid - Out of State | Source: Ambulatory Visit | Attending: Obstetrics and Gynecology | Admitting: Obstetrics and Gynecology

## 2016-02-26 DIAGNOSIS — L0591 Pilonidal cyst without abscess: Secondary | ICD-10-CM | POA: Diagnosis not present

## 2016-02-26 DIAGNOSIS — M533 Sacrococcygeal disorders, not elsewhere classified: Secondary | ICD-10-CM | POA: Diagnosis present

## 2016-02-26 LAB — CBC
HEMATOCRIT: 37.7 % (ref 36.0–46.0)
HEMOGLOBIN: 12.5 g/dL (ref 12.0–15.0)
MCH: 30 pg (ref 26.0–34.0)
MCHC: 33.2 g/dL (ref 30.0–36.0)
MCV: 90.4 fL (ref 78.0–100.0)
Platelets: 182 10*3/uL (ref 150–400)
RBC: 4.17 MIL/uL (ref 3.87–5.11)
RDW: 14.8 % (ref 11.5–15.5)
WBC: 5.4 10*3/uL (ref 4.0–10.5)

## 2016-02-26 MED ORDER — IBUPROFEN 600 MG PO TABS: 600.0000 mg | ORAL_TABLET | Freq: Four times a day (QID) | ORAL | 0 refills | Status: AC | PRN

## 2016-02-26 NOTE — MAU Note (Addendum)
Pt C/O tailbone pain for the last 3-4 day, can feel a lump near her tailbone, thinks it may be a cyst.

## 2016-02-26 NOTE — Discharge Instructions (Signed)
Pilonidal Cyst Introduction A pilonidal cyst is a fluid-filled sac. It forms beneath the skin near your tailbone, at the top of the crease of your buttocks. A pilonidal cyst that is not large or infected may not cause symptoms or problems. If the cyst becomes irritated or infected, it may fill with pus. This causes pain and swelling (pilonidal abscess). An infected cyst may need to be treated with medicine, drained, or removed. What are the causes? The cause of a pilonidal cyst is not known. One cause may be a hair that grows into your skin (ingrown hair). What increases the risk? Pilonidal cysts are more common in boys and men. Risk factors include:  Having lots of hair near the crease of the buttocks.  Being overweight.  Having a pilonidal dimple.  Wearing tight clothing.  Not bathing or showering frequently.  Sitting for long periods of time. What are the signs or symptoms? Signs and symptoms of a pilonidal cyst may include:  Redness.  Pain and tenderness.  Warmth.  Swelling.  Pus.  Fever. How is this diagnosed? Your health care provider may diagnose a pilonidal cyst based on your symptoms and a physical exam. The health care provider may do a blood test to check for infection. If your cyst is draining pus, your health care provider may take a sample of the drainage to be tested at a laboratory. How is this treated? Surgery is the usual treatment for an infected pilonidal cyst. You may also have to take medicines before surgery. The type of surgery you have depends on the size and severity of the infected cyst. The different kinds of surgery include:  Incision and drainage. This is a procedure to open and drain the cyst.  Marsupialization. In this procedure, a large cyst or abscess may be opened and kept open by stitching the edges of the skin to the cyst walls.  Cyst removal. This procedure involves opening the skin and removing all or part of the cyst. Follow these  instructions at home:  Follow all of your surgeon's instructions carefully if you had surgery.  Take medicines only as directed by your health care provider.  If you were prescribed an antibiotic medicine, finish it all even if you start to feel better.  Keep the area around your pilonidal cyst clean and dry.  Clean the area as directed by your health care provider. Pat the area dry with a clean towel. Do not rub it as this may cause bleeding.  Remove hair from the area around the cyst as directed by your health care provider.  Do not wear tight clothing or sit in one place for long periods of time.  There are many different ways to close and cover an incision, including stitches, skin glue, and adhesive strips. Follow your health care provider's instructions on:  Incision care.  Bandage (dressing) changes and removal.  Incision closure removal. Contact a health care provider if:  You have drainage, redness, swelling, or pain at the site of the cyst.  You have a fever. This information is not intended to replace advice given to you by your health care provider. Make sure you discuss any questions you have with your health care provider. Document Released: 01/31/2000 Document Revised: 07/11/2015 Document Reviewed: 06/22/2013  2017 Elsevier  

## 2016-02-26 NOTE — MAU Provider Note (Signed)
Chief Complaint: Tailbone Pain   None     SUBJECTIVE HPI: Alexandra Quinn is a 21 y.o. G1P0010 who presents to maternity admissions reporting pain in her tailbone area x 2-3 days and a small bump near her tailbone noticed yesterday. She reports the pain is mostly with sitting, or when she rides in the car and hits a bump in the road.  She reports the pain is located at the bottom of her tailbone mostly but does extend straight up above her buttocks as well.  She reports some history of pain in her tailbone off and on but never this much pain and she has never felt a bump there.  She has not tried any treatments. The pain is gradually increasing since onset.   She denies vaginal bleeding, vaginal itching/burning, urinary symptoms, h/a, dizziness, n/v, or fever/chills.    While in MAU, pt called out to report that the bump "popped" and is draining yellow fluid.   HPI  Past Medical History:  Diagnosis Date  . Medical history non-contributory    Past Surgical History:  Procedure Laterality Date  . CYST EXCISION    . OVARIAN CYST SURGERY  2013   Social History   Social History  . Marital status: Single    Spouse name: N/A  . Number of children: N/A  . Years of education: N/A   Occupational History  . Not on file.   Social History Main Topics  . Smoking status: Never Smoker  . Smokeless tobacco: Never Used  . Alcohol use No  . Drug use: No  . Sexual activity: Yes   Other Topics Concern  . Not on file   Social History Narrative  . No narrative on file   No current facility-administered medications on file prior to encounter.    Current Outpatient Prescriptions on File Prior to Encounter  Medication Sig Dispense Refill  . butalbital-acetaminophen-caffeine (FIORICET, ESGIC) 50-325-40 MG tablet Take 1-2 tablets by mouth every 6 (six) hours as needed for headache. 20 tablet 0  . acetaminophen (TYLENOL) 325 MG tablet Take 650 mg by mouth every 6 (six) hours as needed for  moderate pain.     No Known Allergies  ROS:  Review of Systems  Constitutional: Negative for chills, fatigue and fever.  Respiratory: Negative for shortness of breath.   Cardiovascular: Negative for chest pain.  Gastrointestinal: Positive for rectal pain.  Genitourinary: Negative for difficulty urinating, dysuria, flank pain, pelvic pain, vaginal bleeding, vaginal discharge and vaginal pain.  Neurological: Negative for dizziness and headaches.  Psychiatric/Behavioral: Negative.      I have reviewed patient's Past Medical Hx, Surgical Hx, Family Hx, Social Hx, medications and allergies.   Physical Exam   Patient Vitals for the past 24 hrs:  BP Temp Temp src Pulse Resp  02/26/16 1618 126/68 98.4 F (36.9 C) - 60 16  02/26/16 1446 128/70 98.4 F (36.9 C) Oral 89 18   Constitutional: Well-developed, well-nourished female in no acute distress.  Cardiovascular: normal rate Respiratory: normal effort GI: Abd soft, non-tender. Pos BS x 4 MS: Extremities nontender, no edema, normal ROM Neurologic: Alert and oriented x 4.  GU: Neg CVAT.  Visual inspection of gluteal fold is wnl but pt tender to palpation at natal cleft and for 4-5 cm above this area. No erythema, edema, or exudate noted on initial exam.  Palpable smooth round soft and fluctuant mass, 1x1x1 cm in natal cleft and slightly to right.    Reinspection after pt reported  drainage reveals significant thin purulent drainage from previous mass and mass palpable smaller in size with additional purulent drainage noted during exam.   LAB RESULTS Results for orders placed or performed during the hospital encounter of 02/26/16 (from the past 24 hour(s))  CBC     Status: None   Collection Time: 02/26/16  4:10 PM  Result Value Ref Range   WBC 5.4 4.0 - 10.5 K/uL   RBC 4.17 3.87 - 5.11 MIL/uL   Hemoglobin 12.5 12.0 - 15.0 g/dL   HCT 13.0 86.5 - 78.4 %   MCV 90.4 78.0 - 100.0 fL   MCH 30.0 26.0 - 34.0 pg   MCHC 33.2 30.0 - 36.0  g/dL   RDW 69.6 29.5 - 28.4 %   Platelets 182 150 - 400 K/uL       IMAGING No results found.  MAU Management/MDM: CBC wnl with no indication of systemic infection or of cellulitis surrounding cyst.  Likely pilonidal cyst that self-drained today. Ibuprofen 600 mg Q 6 hours PRN. Pt to follow up with primary care as pilonidal cyst will likely recur.  Return to ED if worsening signs and symptoms or signs of infection.  Pt stable at time of discharge.  ASSESSMENT 1. Pilonidal cyst     PLAN Discharge home Pt has insurance from [place] and is unsure what providers she can see in [place]. She is here as a Archivist. She will call her insurance company to find out how to locate primary care covered by her insurance.  Pt given local primary care offices to contact as needed. Return to ED if pain persists or worsens or any signs of infection.  Follow-up Information    [facility] Follow up.   Contact information: [address] [place] [address] [phone]       [facility] Follow up.   Specialty:  Family Medicine Contact information: [address] [place] [address] [phone]           Sharen Counter Certified Nurse-Midwife 02/26/2016  5:06 PM

## 2016-03-19 ENCOUNTER — Telehealth (HOSPITAL_COMMUNITY): Payer: Self-pay | Admitting: Emergency Medicine

## 2016-03-19 ENCOUNTER — Encounter (HOSPITAL_COMMUNITY): Payer: Self-pay | Admitting: *Deleted

## 2016-03-19 ENCOUNTER — Ambulatory Visit (HOSPITAL_COMMUNITY)
Admission: EM | Admit: 2016-03-19 | Discharge: 2016-03-19 | Disposition: A | Discharge status: Home / Self Care | Payer: PRIVATE HEALTH INSURANCE | Attending: Family Medicine | Admitting: Family Medicine

## 2016-03-19 DIAGNOSIS — J069 Acute upper respiratory infection, unspecified: Secondary | ICD-10-CM | POA: Diagnosis not present

## 2016-03-19 MED ORDER — AMOXICILLIN 875 MG PO TABS: 875.0000 mg | ORAL_TABLET | Freq: Two times a day (BID) | ORAL | 0 refills | Status: AC

## 2016-03-19 NOTE — Telephone Encounter (Signed)
Called [facility] and spoke with pharmacist: diflucan 150mg  x 2 , take one now and one in a week if needed.  Ordered by lauenstein.    Patient notified and informed where medicine called to and how to take medicine.

## 2016-03-19 NOTE — Discharge Instructions (Signed)
Please return if your throat is not improving over the next 48 hours

## 2016-03-19 NOTE — ED Provider Notes (Signed)
[facility]    CSN: [id number] Arrival date & time: 03/19/16  1414     History   Chief Complaint Chief Complaint  Patient presents with  . Sore Throat  . Otalgia  . Cough    HPI Alexandra Quinn is a 21 y.o. female.   This is a 22 year old 21-year-old female with cough, sore throat, left earache who presents to the [facility] urgent care center today, March 19, 2016. The problem actually started on Tuesday, 2 days ago. She's developed earache over the last 24 hours in both ears but primarily in the left side.  Patient goes to [employer] and is majoring in criminal justice      Past Medical History:  Diagnosis Date  . Medical history non-contributory     There are no active problems to display for this patient.   Past Surgical History:  Procedure Laterality Date  . CYST EXCISION    . OVARIAN CYST SURGERY  2013    OB History    Gravida Para Term Preterm AB Living   1 0 0 0 1 0   SAB TAB Ectopic Multiple Live Births   1               Home Medications    Prior to Admission medications   Medication Sig Start Date End Date Taking? Authorizing Provider  acetaminophen (TYLENOL) 325 MG tablet Take 650 mg by mouth every 6 (six) hours as needed for moderate pain.    Historical Provider, MD  amoxicillin (AMOXIL) 875 MG tablet Take 1 tablet (875 mg total) by mouth 2 (two) times daily. 03/19/16   Alexandra SidleKurt Enijah Furr, MD  butalbital-acetaminophen-caffeine (FIORICET, ESGIC) [phone] MG tablet Take 1-2 tablets by mouth every 6 (six) hours as needed for headache. 12/29/15 12/28/16  Cheri FowlerKayla Rose, PA-C  ibuprofen (ADVIL,MOTRIN) 600 MG tablet Take 1 tablet (600 mg total) by mouth every 6 (six) hours as needed. 02/26/16   Hurshel PartyLisa A Leftwich-Kirby, CNM    Family History History reviewed. No pertinent family history.  Social History Social History  Substance Use Topics  . Smoking status: Never Smoker  . Smokeless tobacco: Never Used    . Alcohol use No     Allergies   Patient has no known allergies.   Review of Systems Review of Systems  Constitutional: Negative.   HENT: Positive for ear pain.   Respiratory: Positive for cough.   Gastrointestinal: Negative.   Musculoskeletal: Negative.      Physical Exam Triage Vital Signs ED Triage Vitals  Enc Vitals Group     BP 03/19/16 1525 149/100     Pulse Rate 03/19/16 1525 60     Resp 03/19/16 1525 17     Temp 03/19/16 1525 97.3 F (36.3 C)     Temp Source 03/19/16 1525 Oral     SpO2 03/19/16 1525 100 %     Weight --      Height --      Head Circumference --      Peak Flow --      Pain Score 03/19/16 1526 9     Pain Loc --      Pain Edu? --      Excl. in GC? --    No data found.   Updated Vital Signs BP 149/100 (BP Location: Left Arm)   Pulse 60   Temp 97.3 F (36.3 C) (Oral)   Resp 17   SpO2 100%  Physical Exam  Constitutional: She is oriented to person, place, and time. She appears well-developed and well-nourished.  HENT:  Head: Normocephalic.  Right Ear: External ear normal.  Left Ear: External ear normal.  Mouth/Throat: Oropharynx is clear and moist.  Throat is very red posteriorly  Eyes: Conjunctivae are normal.  Neck: Normal range of motion. Neck supple.  Pulmonary/Chest: Effort normal.  Musculoskeletal: Normal range of motion.  Neurological: She is alert and oriented to person, place, and time.  Skin: Skin is warm and dry.  Nursing note and vitals reviewed.    UC Treatments / Results  Labs (all labs ordered are listed, but only abnormal results are displayed) Labs Reviewed - No data to display  EKG  EKG Interpretation None       Radiology No results found.  Procedures Procedures (including critical care time)  Medications Ordered in UC Medications - No data to display   Initial Impression / Assessment and Plan / UC Course  I have reviewed the triage vital signs and the nursing notes.  Pertinent labs &  imaging results that were available during my care of the patient were reviewed by me and considered in my medical decision making (see chart for details).     Final Clinical Impressions(s) / UC Diagnoses   Final diagnoses:  Upper respiratory tract infection, unspecified type    New Prescriptions New Prescriptions   AMOXICILLIN (AMOXIL) 875 MG TABLET    Take 1 tablet (875 mg total) by mouth 2 (two) times daily.     Alexandra Sidle, MD 03/19/16 [phone]

## 2016-10-18 IMAGING — US US PELVIS COMPLETE
1 series · 15 of 25 positions shown · non-contrast
Comparison: No priors.

CLINICAL DATA: 18-year-old female with left lower quadrant pain
since September 2014.

EXAM:
TRANSABDOMINAL AND TRANSVAGINAL ULTRASOUND OF PELVIS
TECHNIQUE: Both transabdominal and transvaginal ultrasound examinations of the
pelvis were performed. Transabdominal technique was performed for
global imaging of the pelvis including uterus, ovaries, adnexal
regions, and pelvic cul-de-sac. It was necessary to proceed with
endovaginal exam following the transabdominal exam to visualize the
endometrium and ovaries.

[Series 1: us pelvis complete · 15 of 69 slices shown]
[im 1/69]
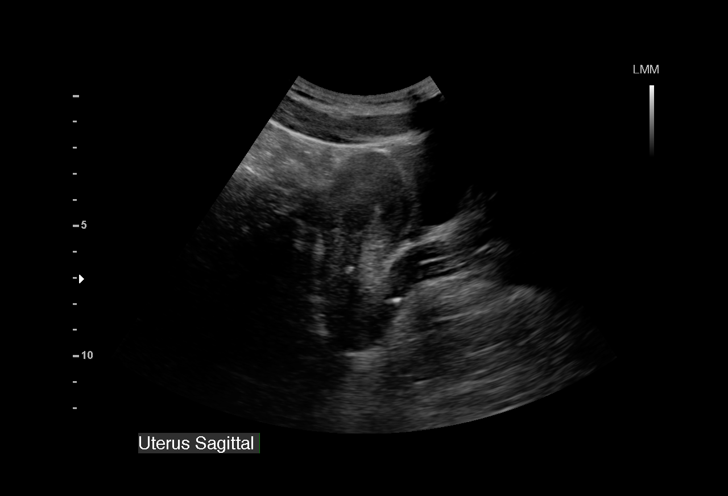
[im 6/69]
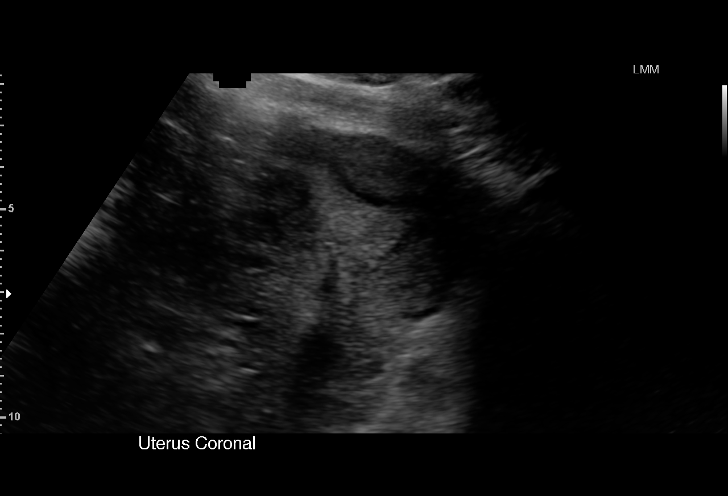
[im 12/69]
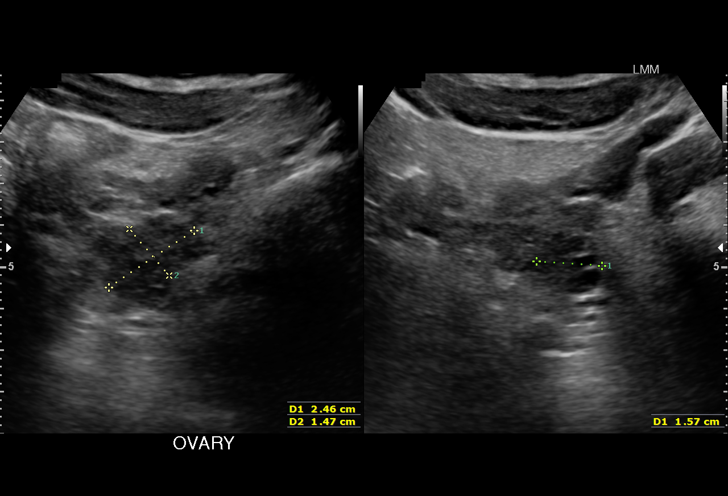
[im 15/69]
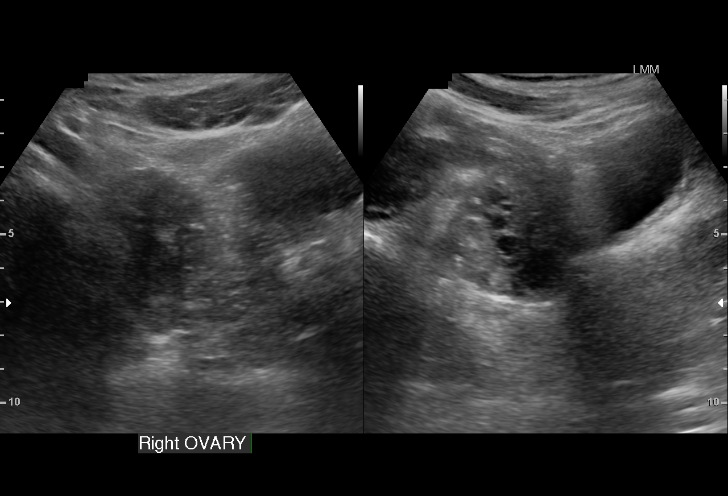
[im 20/69]
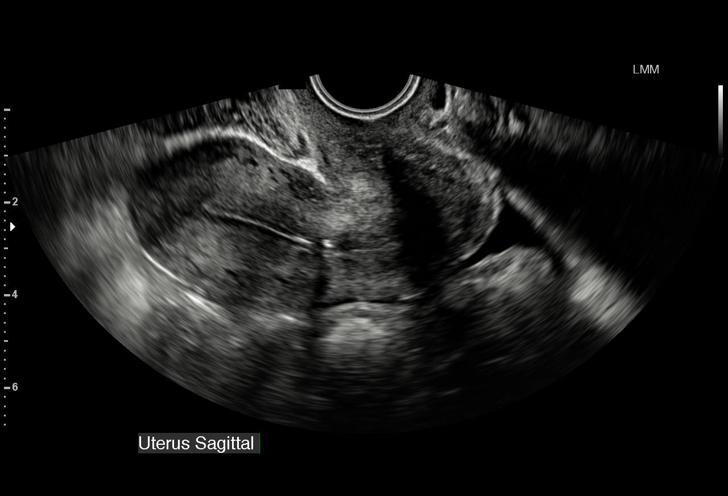
[im 26/69]
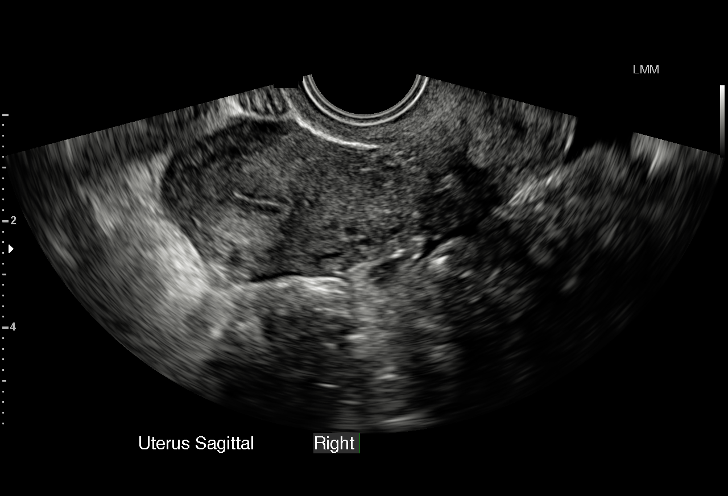
[im 29/69]
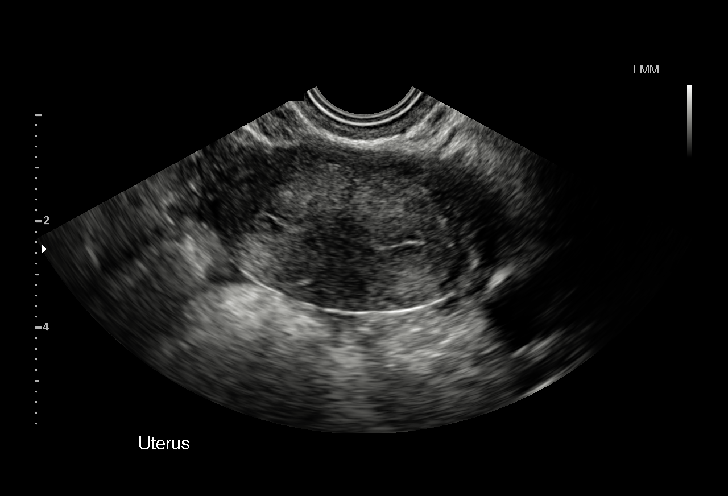
[im 35/69]
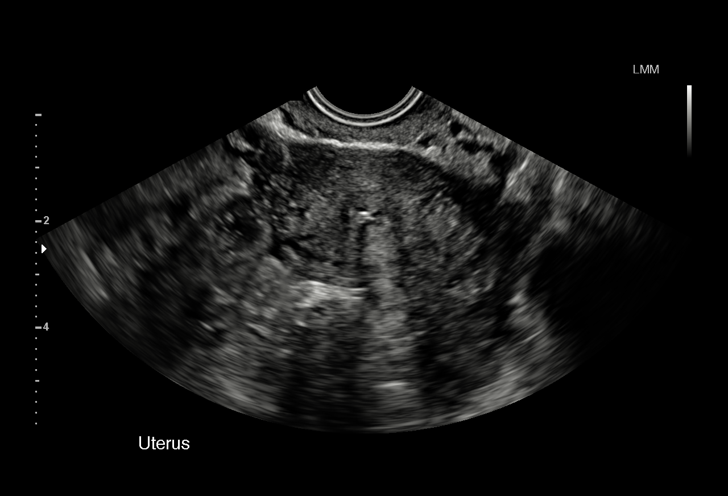
[im 40/69]
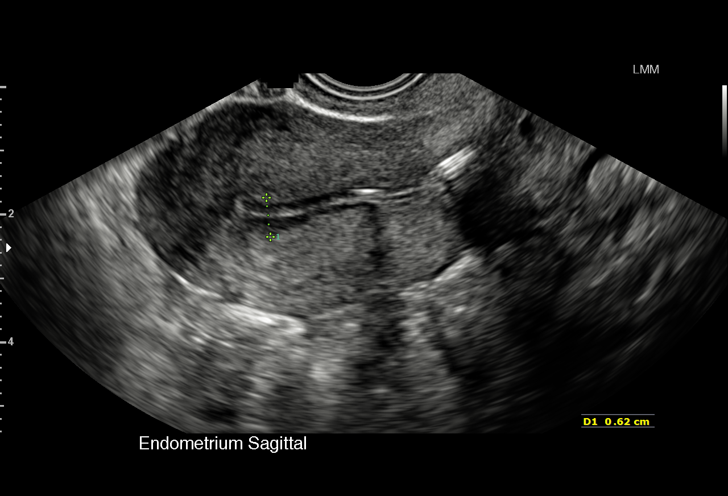
[im 43/69]
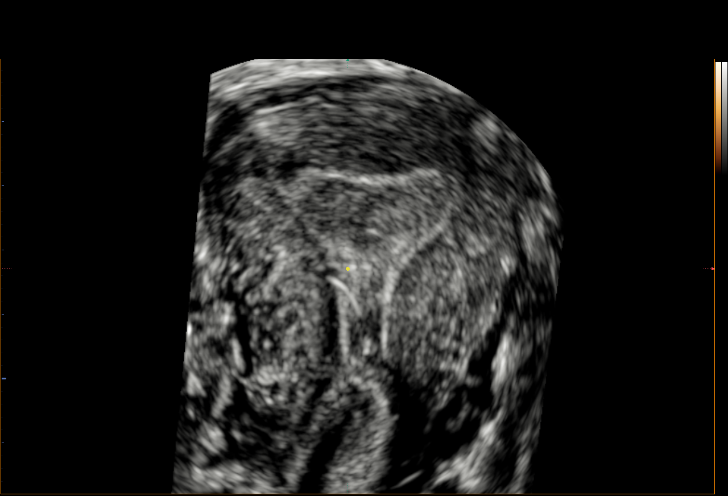
[im 49/69]
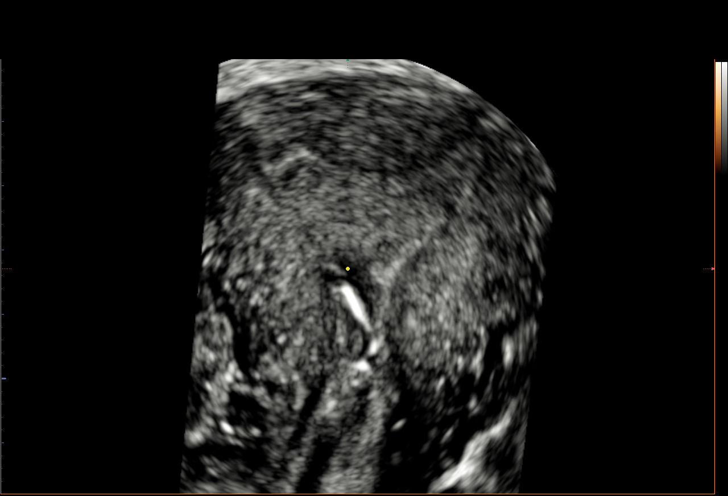
[im 54/69]
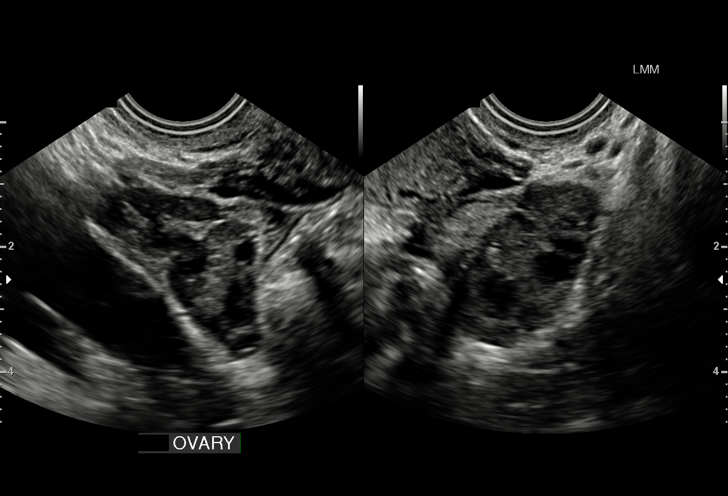
[im 57/69]
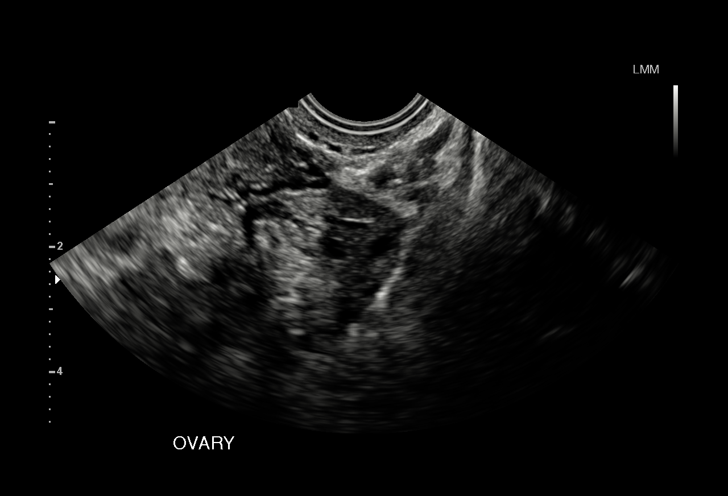
[im 63/69]
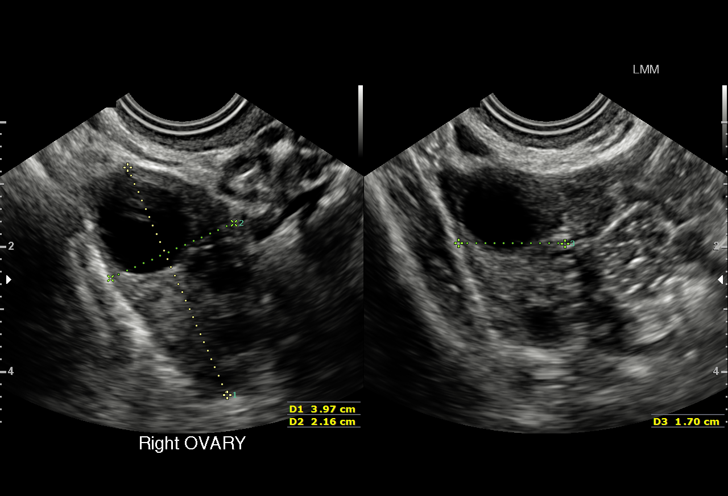
[im 69/69]
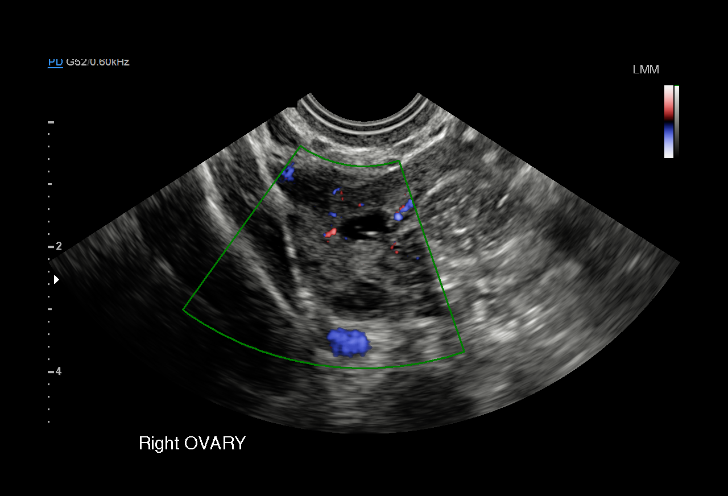

[15 of 25 positions shown; findings below may reference images not displayed]

FINDINGS: Uterus

Measurements: 7.9 x 3.5 x 5.1 cm. No fibroids or other mass
visualized.

Endometrium

Thickness: 6.2 mm. No focal abnormality visualized. IUD appears to
be in a low position predominantly in the region of the lower
uterine segment and cervix.

Right ovary

Measurements: 4.0 x 2.2 x 1.7 cm. Normal appearance/no adnexal mass.

Left ovary

Measurements: 3.0 x 1.7 x 1.4 cm. Normal appearance/no adnexal mass.

Other findings

Trace volume of free fluid in the cul-de-sac, presumably physiologic
in this young female.
IMPRESSION: 1. IUD is in a low position in the lower uterine segment and
proximal cervix.
2. No other acute findings to account for the patient's symptoms.

## 2016-11-17 ENCOUNTER — Inpatient Hospital Stay (HOSPITAL_COMMUNITY): Payer: Medicaid - Out of State

## 2016-11-17 ENCOUNTER — Encounter (HOSPITAL_COMMUNITY): Payer: Self-pay

## 2016-11-17 ENCOUNTER — Inpatient Hospital Stay (HOSPITAL_COMMUNITY)
Admission: AD | Admit: 2016-11-17 | Discharge: 2016-11-17 | Disposition: A | Discharge status: Home / Self Care | Payer: Medicaid - Out of State | Source: Ambulatory Visit | Attending: Family Medicine | Admitting: Family Medicine

## 2016-11-17 DIAGNOSIS — N83209 Unspecified ovarian cyst, unspecified side: Secondary | ICD-10-CM | POA: Diagnosis present

## 2016-11-17 DIAGNOSIS — R102 Pelvic and perineal pain: Secondary | ICD-10-CM | POA: Diagnosis present

## 2016-11-17 DIAGNOSIS — Z87898 Personal history of other specified conditions: Secondary | ICD-10-CM | POA: Diagnosis not present

## 2016-11-17 HISTORY — DX: Unspecified ovarian cyst, unspecified side: N83.209

## 2016-11-17 LAB — URINALYSIS, ROUTINE W REFLEX MICROSCOPIC
Bilirubin Urine: NEGATIVE
Glucose, UA: NEGATIVE mg/dL
Hgb urine dipstick: NEGATIVE
Ketones, ur: NEGATIVE mg/dL
LEUKOCYTES UA: NEGATIVE
NITRITE: NEGATIVE
PROTEIN: NEGATIVE mg/dL
SPECIFIC GRAVITY, URINE: 1.019 (ref 1.005–1.030)
pH: 5 (ref 5.0–8.0)

## 2016-11-17 LAB — POCT PREGNANCY, URINE: PREG TEST UR: NEGATIVE

## 2016-11-17 MED ORDER — KETOROLAC TROMETHAMINE 30 MG/ML IJ SOLN
15.0000 mg | Freq: Once | INTRAMUSCULAR | Status: AC
  Administered 2016-11-17: 15 mg via INTRAMUSCULAR
  Filled 2016-11-17: qty 1

## 2016-11-17 MED ORDER — KETOROLAC TROMETHAMINE 30 MG/ML IJ SOLN: 15.0000 mg | Freq: Once | INTRAMUSCULAR | Status: DC

## 2016-11-17 NOTE — MAU Note (Signed)
Pt states she started having lower abdominal pain that started last week.  She said she came from [facility] and said her test was negative and that she has a history of ovarian cysts.  Denies VB.

## 2016-11-17 NOTE — Discharge Instructions (Signed)
Pelvic Pain, Female °Pelvic pain is pain in your lower belly (abdomen), below your belly button and between your hips. The pain may start suddenly (acute), keep coming back (recurring), or last a long time (chronic). Pelvic pain that lasts longer than six months is considered chronic. There are many causes of pelvic pain. Sometimes the cause of your pelvic pain is not known. °Follow these instructions at home: °· Take over-the-counter and prescription medicines only as told by your doctor. °· Rest as told by your doctor. °· Do not have sex it if hurts. °· Keep a journal of your pelvic pain. Write down: °? When the pain started. °? Where the pain is located. °? What seems to make the pain better or worse, such as food or your menstrual cycle. °? Any symptoms you have along with the pain. °· Keep all follow-up visits as told by your doctor. This is important. °Contact a doctor if: °· Medicine does not help your pain. °· Your pain comes back. °· You have new symptoms. °· You have unusual vaginal discharge or bleeding. °· You have a fever or chills. °· You are having a hard time pooping (constipation). °· You have blood in your pee (urine) or poop (stool). °· Your pee smells bad. °· You feel weak or lightheaded. °Get help right away if: °· You have sudden pain that is very bad. °· Your pain continues to get worse. °· You have very bad pain and also have any of the following symptoms: °? A fever. °? Feeling stick to your stomach (nausea). °? Throwing up (vomiting). °? Being very sweaty. °· You pass out (lose consciousness). °This information is not intended to replace advice given to you by your health care provider. Make sure you discuss any questions you have with your health care provider. °Document Released: 07/22/2007 Document Revised: 02/27/2015 Document Reviewed: 11/23/2014 °Elsevier Interactive Patient Education © 2018 Elsevier Inc. ° °

## 2016-11-17 NOTE — MAU Provider Note (Signed)
Chief Complaint: Abdominal Pain   SUBJECTIVE HPI: Alexandra Quinn is a 21 y.o. G1P0010 who presents to MAU with complaints of lower abdominal pain. Patient states that she has had this pain for over a week. It feels like it is getting worse. Rates pain 10/10 ins severity. Describes the pain as cramping in nature. Has not taken anything for pain. Has a history of ovarian cyst for which she needed surgery. States that the pain feels the same as before. Denies any vaginal bleeding, vaginal discharge, fevers, n/v, and dysuria. Patient is sexually active. Not on birth control. Patient's last menstrual period was 10/15/2016. She has history of irregular menses.    Past Medical History:  Diagnosis Date  . Medical history non-contributory   . Ovarian cyst    OB History  Gravida Para Term Preterm AB Living  1 0 0 0 1 0  SAB TAB Ectopic Multiple Live Births  1            # Outcome Date GA Lbr Len/2nd Weight Sex Delivery Anes PTL Lv  1 SAB              Past Surgical History:  Procedure Laterality Date  . CYST EXCISION    . OVARIAN CYST SURGERY  2013   Social History   Social History  . Marital status: Single    Spouse name: N/A  . Number of children: N/A  . Years of education: N/A   Occupational History  . Not on file.   Social History Main Topics  . Smoking status: Never Smoker  . Smokeless tobacco: Never Used  . Alcohol use No  . Drug use: No  . Sexual activity: Yes    Birth control/ protection: Condom   Other Topics Concern  . Not on file   Social History Narrative  . No narrative on file   No current facility-administered medications on file prior to encounter.    Current Outpatient Prescriptions on File Prior to Encounter  Medication Sig Dispense Refill  . acetaminophen (TYLENOL) 325 MG tablet Take 650 mg by mouth every 6 (six) hours as needed for moderate pain.    Marland Kitchen amoxicillin (AMOXIL) 875 MG tablet Take 1 tablet (875 mg total) by mouth 2 (two) times daily. 20  tablet 0  . butalbital-acetaminophen-caffeine (FIORICET, ESGIC) 50-325-40 MG tablet Take 1-2 tablets by mouth every 6 (six) hours as needed for headache. 20 tablet 0  . ibuprofen (ADVIL,MOTRIN) 600 MG tablet Take 1 tablet (600 mg total) by mouth every 6 (six) hours as needed. 30 tablet 0   No Known Allergies  I have reviewed the past Medical Hx, Surgical Hx, Social Hx, Allergies and Medications.   REVIEW OF SYSTEMS All systems reviewed and are negative for acute change except as noted in the HPI.   OBJECTIVE BP 113/88 (BP Location: Right Arm)   Temp 98.4 F (36.9 C) (Oral)   Resp 16   LMP 10/15/2016   SpO2 100%    PHYSICAL EXAM Constitutional: Well-developed, well-nourished female in no acute distress.  Cardiovascular: normal rate and rhythm, pulses intact Respiratory: normal rate and effort.  GI: Abd soft, non-tender to palpation, non-distended. Pos BS. No guarding or rebound tenderness.  MS: Extremities nontender, no edema, normal ROM Neurologic: Alert and oriented x 4. No focal deficits Psych: normal mood and affect  LAB RESULTS Results for orders placed or performed during the hospital encounter of 11/17/16 (from the past 24 hour(s))  Urinalysis, Routine w reflex microscopic  Status: None   Collection Time: 11/17/16  8:06 PM  Result Value Ref Range   Color, Urine YELLOW YELLOW   APPearance CLEAR CLEAR   Specific Gravity, Urine 1.019 1.005 - 1.030   pH 5.0 5.0 - 8.0   Glucose, UA NEGATIVE NEGATIVE mg/dL   Hgb urine dipstick NEGATIVE NEGATIVE   Bilirubin Urine NEGATIVE NEGATIVE   Ketones, ur NEGATIVE NEGATIVE mg/dL   Protein, ur NEGATIVE NEGATIVE mg/dL   Nitrite NEGATIVE NEGATIVE   Leukocytes, UA NEGATIVE NEGATIVE  Pregnancy, urine POC     Status: None   Collection Time: 11/17/16  8:44 PM  Result Value Ref Range   Preg Test, Ur NEGATIVE NEGATIVE    IMAGING US Pelvic Complete With Transvaginal  Result Date: 11/17/2016 CLINICAL DATA:  Pelvic pain for 1 week.  Last menstrual period October 15, 2016. EXAM: TRANSABDOMINAL AND TRANSVAGINAL ULTRASOUND OF PELVIS TECHNIQUE: Both transabdominal and transvaginal ultrasound examinations of the pelvis were performed. Transabdominal technique was performed for global imaging of the pelvis including uterus, ovaries, adnexal regions, and pelvic cul-de-sac. It was necessary to proceed with endovaginal exam following the transabdominal exam to visualize the adnexum and endometrium. COMPARISON:  None FINDINGS: Uterus Measurements: 8 x 3.7 x 5.1 cm. No fibroids or other mass visualized. Endometrium Thickness: 9 mm.  No focal abnormality visualized. Right ovary Measurements: 3.7 x 2.1 x 2.3 cm. Normal appearance/no adnexal mass. Left ovary Measurements: 3.5 x 1.5 x 1.4 cm. Normal appearance/no adnexal mass. Other findings Small amount of free fluid in the pelvis likely physiologic. IMPRESSION: Negative pelvic ultrasound. Electronically Signed   By: Awilda Metro M.D.   On: 11/17/2016 22:19    MAU COURSE Vitals and nursing notes reviewed I have ordered labs/imaging and reviewed them Pregnancy test negative UA unremarkable Pelvic US without pathology; normal ovaries and uterus. Treatments given in MAU: IM Toradol with improvement in symtpoms  MDM Plan of care reviewed with patient, including labs and tests ordered and medical treatment.   ASSESSMENT 1. Pelvic pain     PLAN Discharge home in stable condition Discussed conservative measures to help treat symptoms with NSAIDs Counseled on return precautions Encouraged patient to follow-up with OBGYN for further work-up of pelvic pain Handout given   Caryl Ada, DO [facility], [place] [facility] [place] 11/17/2016, 9:36 PM

## 2016-11-17 NOTE — MAU Note (Signed)
Pt reports she does not have sex often due to pain during.

## 2017-04-21 IMAGING — CR DG FINGER INDEX 2+V*R*
3 series · 3 of 3 positions shown · non-contrast
Comparison: None.

CLINICAL DATA: The patient crushed her right index finger in a door
today. Pain. Initial encounter.

EXAM:
RIGHT INDEX FINGER 2+V

[x finger obl right]
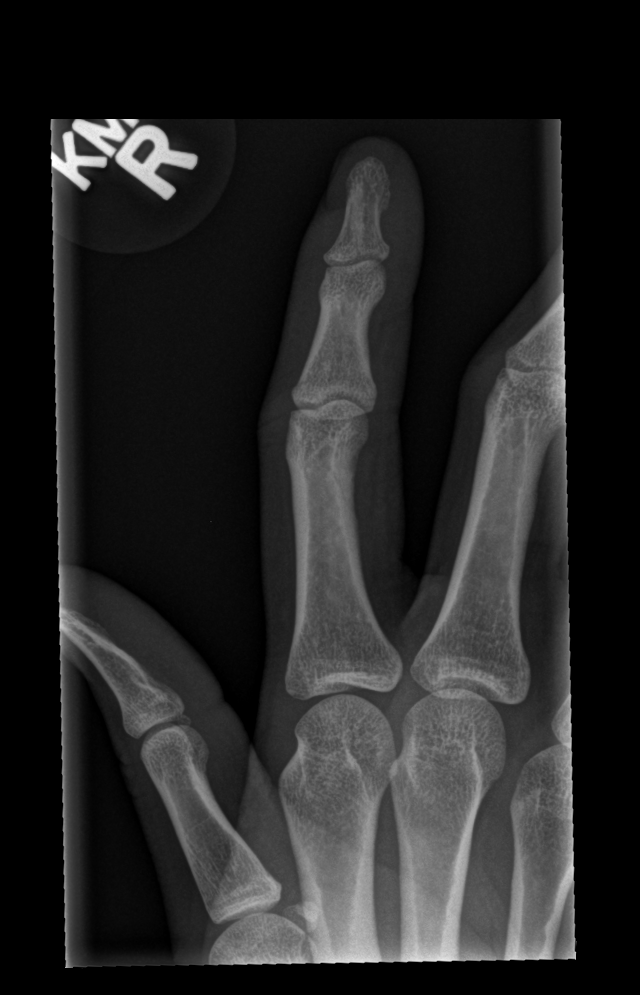

[x finger lat right (1 of 2)]
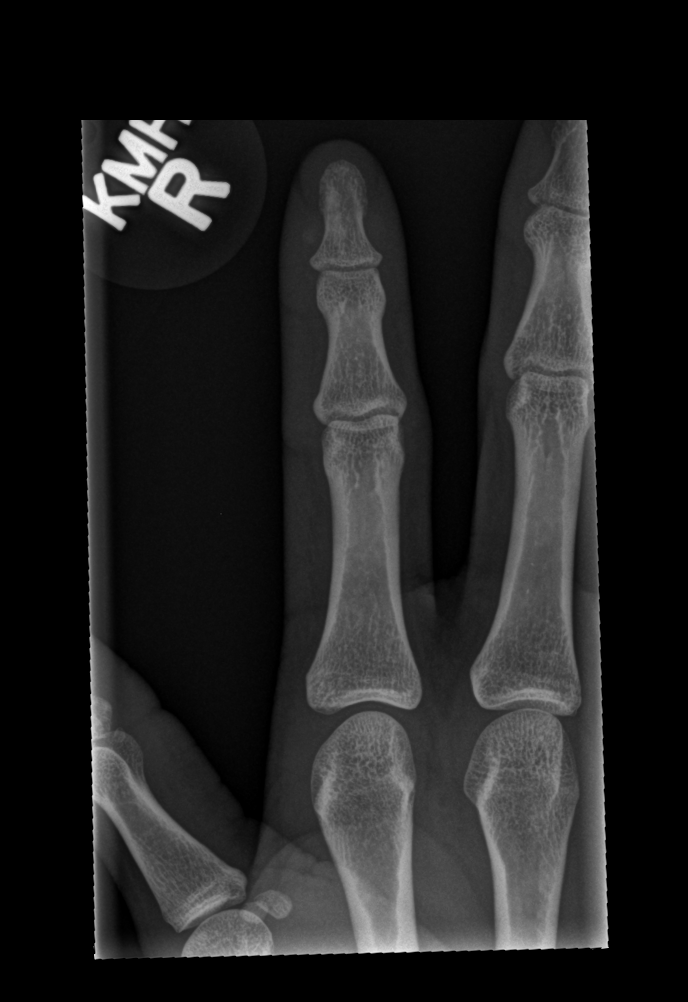

[x finger lat right (2 of 2)]
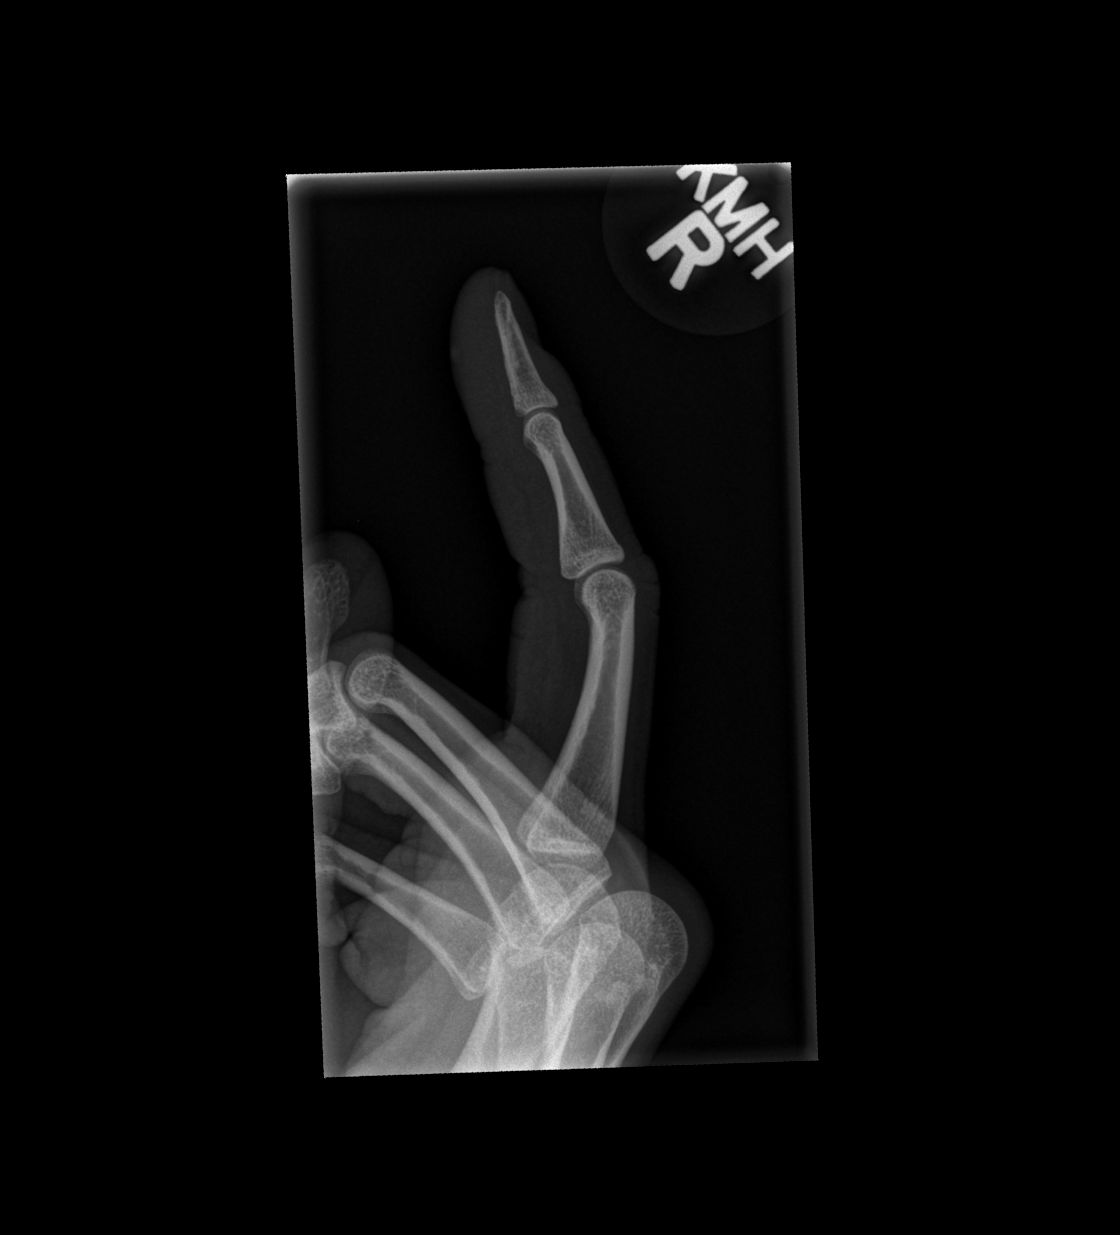

[3 of 3 positions shown; findings below may reference images not displayed]

FINDINGS: There is no evidence of fracture or dislocation. There is no
evidence of arthropathy or other focal bone abnormality. Soft
tissues are unremarkable.
IMPRESSION: Negative exam.
